# Patient Record
Sex: Male | Born: 1955
Health system: Southern US, Community
[De-identification: ages and names within clinical notes are randomized; demographics above are authoritative.]

## PROBLEM LIST (undated history)

## (undated) DIAGNOSIS — G40909 Epilepsy, unspecified, not intractable, without status epilepticus: Secondary | ICD-10-CM

## (undated) DIAGNOSIS — R569 Unspecified convulsions: Secondary | ICD-10-CM

## (undated) DIAGNOSIS — E785 Hyperlipidemia, unspecified: Secondary | ICD-10-CM

## (undated) DIAGNOSIS — N390 Urinary tract infection, site not specified: Secondary | ICD-10-CM

## (undated) HISTORY — DX: Hyperlipidemia, unspecified: E78.5

## (undated) HISTORY — DX: Urinary tract infection, site not specified: N39.0

## (undated) HISTORY — DX: Epilepsy, unspecified, not intractable, without status epilepticus: G40.909

## (undated) HISTORY — DX: Unspecified convulsions: R56.9

---

## 1973-03-02 HISTORY — PX: NASAL SEPTUM SURGERY: SHX37

## 2003-06-04 ENCOUNTER — Ambulatory Visit (HOSPITAL_COMMUNITY): Admission: RE | Admit: 2003-06-04 | Discharge: 2003-06-04 | Payer: Self-pay | Admitting: Family Medicine

## 2003-12-03 ENCOUNTER — Ambulatory Visit (HOSPITAL_COMMUNITY): Admission: RE | Admit: 2003-12-03 | Discharge: 2003-12-03 | Payer: Self-pay | Admitting: Neurology

## 2004-09-12 ENCOUNTER — Ambulatory Visit (HOSPITAL_COMMUNITY): Admission: RE | Admit: 2004-09-12 | Discharge: 2004-09-12 | Payer: Self-pay | Admitting: Gastroenterology

## 2005-10-16 ENCOUNTER — Ambulatory Visit (HOSPITAL_COMMUNITY): Admission: RE | Admit: 2005-10-16 | Discharge: 2005-10-16 | Payer: Self-pay | Admitting: Gastroenterology

## 2006-03-27 LAB — HM COLONOSCOPY

## 2010-09-25 ENCOUNTER — Ambulatory Visit (INDEPENDENT_AMBULATORY_CARE_PROVIDER_SITE_OTHER): Payer: BC Managed Care – PPO | Admitting: Family Medicine

## 2010-09-25 ENCOUNTER — Encounter: Payer: Self-pay | Admitting: Family Medicine

## 2010-09-25 VITALS — BP 118/70 | HR 72 | Temp 98.1°F | Resp 12 | Ht 69.5 in | Wt 193.0 lb

## 2010-09-25 DIAGNOSIS — Z8669 Personal history of other diseases of the nervous system and sense organs: Secondary | ICD-10-CM | POA: Insufficient documentation

## 2010-09-25 DIAGNOSIS — E785 Hyperlipidemia, unspecified: Secondary | ICD-10-CM

## 2010-09-25 MED ORDER — COLESEVELAM HCL 625 MG PO TABS: 1250.0000 mg | ORAL_TABLET | Freq: Two times a day (BID) | ORAL | Status: DC

## 2010-09-25 NOTE — Progress Notes (Signed)
  Subjective:    Patient ID: Curtis Sheppard, male    DOB: Sep 14, 1955, 55 y.o.   MRN: [id number]  HPI Patient is new to establish care. Past medical history reviewed. History of solitary seizure about 6 years ago but none since then. He sees the neurologist maintained on Dilantin. He has history of dyslipidemia. Previous myalgia with Lipitor apparently did not try other statins. He was concerned that somehow severe leg cramps might have triggered his seizure. He does take WelChol for lipids. No myalgias. No side effects. Patient also takes omega-3 supplement. He is allergic to penicillin. Previous tonsillectomy.  Mother and father had lung cancer. Brother had coronary disease in his 21s. Maternal grandmother dementia. One brother with bipolar disorder  Patient is married. Occasional alcohol use. Nonsmoker.   Review of Systems  Constitutional: Negative for fever, chills and appetite change.  Eyes: Negative for visual disturbance.  Respiratory: Negative for cough and shortness of breath.   Cardiovascular: Negative for chest pain.  Gastrointestinal: Negative for abdominal pain.  Skin: Negative for rash.  Neurological: Negative for dizziness, syncope, weakness and headaches.  Psychiatric/Behavioral: Negative for dysphoric mood.       Objective:   Physical Exam  Constitutional: He is oriented to person, place, and time. He appears well-developed and well-nourished.  HENT:  Mouth/Throat: Oropharynx is clear and moist.  Neck: Neck supple. No thyromegaly present.  Cardiovascular: Normal rate, regular rhythm and normal heart sounds.   Pulmonary/Chest: Effort normal and breath sounds normal. No respiratory distress. He has no wheezes. He has no rales.  Musculoskeletal: He exhibits no edema.  Lymphadenopathy:    He has no cervical adenopathy.  Neurological: He is alert and oriented to person, place, and time.          Assessment & Plan:  #1 history of dyslipidemia. Refill WelChol.  Physical by December and recheck lipids then #2 history of seizures. None in 6 years. Continue followup with neurology

## 2011-01-15 ENCOUNTER — Other Ambulatory Visit: Payer: Self-pay | Admitting: Family Medicine

## 2011-01-15 DIAGNOSIS — E785 Hyperlipidemia, unspecified: Secondary | ICD-10-CM

## 2011-01-15 MED ORDER — COLESEVELAM HCL 625 MG PO TABS: 1250.0000 mg | ORAL_TABLET | Freq: Two times a day (BID) | ORAL | Status: DC

## 2011-01-15 NOTE — Telephone Encounter (Signed)
Pt called req refill of colesevelam (WELCHOL) 625 MG tablet to be called in to [facility] on [address]. Pt completely out of med. Pt has sch cpx for 03/23/11 and Labs on 03/13/11.

## 2011-01-19 ENCOUNTER — Telehealth: Payer: Self-pay | Admitting: Family Medicine

## 2011-01-19 NOTE — Telephone Encounter (Signed)
Pt called re: his refill of colesevelam (WELCHOL) 625 MG tablet. Pt checked with [facility] on Friday and Saturday, pharmacy did not rcv script. Pls resend.

## 2011-01-19 NOTE — Telephone Encounter (Signed)
I called [facility], they have 2 meds ready to pick-up, pt informed

## 2011-03-05 ENCOUNTER — Other Ambulatory Visit: Payer: Self-pay | Admitting: *Deleted

## 2011-03-05 MED ORDER — DUTASTERIDE 0.5 MG PO CAPS: 0.5000 mg | ORAL_CAPSULE | Freq: Every day | ORAL | Status: DC

## 2011-03-13 ENCOUNTER — Other Ambulatory Visit (INDEPENDENT_AMBULATORY_CARE_PROVIDER_SITE_OTHER): Payer: BC Managed Care – PPO

## 2011-03-13 DIAGNOSIS — Z0000 Encounter for general adult medical examination without abnormal findings: Secondary | ICD-10-CM

## 2011-03-13 LAB — CBC WITH DIFFERENTIAL/PLATELET
Basophils Absolute: 0 10*3/uL (ref 0.0–0.1)
Basophils Relative: 0.7 % (ref 0.0–3.0)
Eosinophils Absolute: 0.2 10*3/uL (ref 0.0–0.7)
Eosinophils Relative: 4.5 % (ref 0.0–5.0)
HCT: 44.7 % (ref 39.0–52.0)
Hemoglobin: 15.5 g/dL (ref 13.0–17.0)
Lymphocytes Relative: 30 % (ref 12.0–46.0)
Lymphs Abs: 1.6 10*3/uL (ref 0.7–4.0)
MCHC: 34.6 g/dL (ref 30.0–36.0)
MCV: 97.6 fl (ref 78.0–100.0)
Monocytes Absolute: 0.3 10*3/uL (ref 0.1–1.0)
Monocytes Relative: 6.2 % (ref 3.0–12.0)
Neutro Abs: 3.1 10*3/uL (ref 1.4–7.7)
Neutrophils Relative %: 58.6 % (ref 43.0–77.0)
Platelets: 188 10*3/uL (ref 150.0–400.0)
RBC: 4.59 Mil/uL (ref 4.22–5.81)
RDW: 13.1 % (ref 11.5–14.6)
WBC: 5.3 10*3/uL (ref 4.5–10.5)

## 2011-03-13 LAB — POCT URINALYSIS DIPSTICK
Bilirubin, UA: NEGATIVE
Blood, UA: NEGATIVE
Glucose, UA: NEGATIVE
Ketones, UA: NEGATIVE
Leukocytes, UA: NEGATIVE
Nitrite, UA: NEGATIVE
Protein, UA: NEGATIVE
Spec Grav, UA: 1.02
Urobilinogen, UA: 0.2
pH, UA: 6.5

## 2011-03-13 LAB — LIPID PANEL
Cholesterol: 261 mg/dL — ABNORMAL HIGH (ref 0–200)
HDL: 56.3 mg/dL (ref >39.00)
Total CHOL/HDL Ratio: 5
Triglycerides: 256 mg/dL — ABNORMAL HIGH (ref 0.0–149.0)
VLDL: 51.2 mg/dL — ABNORMAL HIGH (ref 0.0–40.0)

## 2011-03-13 LAB — PSA: PSA: 2.4 ng/mL (ref 0.10–4.00)

## 2011-03-13 LAB — HEPATIC FUNCTION PANEL
ALT: 27 U/L (ref 0–53)
AST: 20 U/L (ref 0–37)
Albumin: 4.3 g/dL (ref 3.5–5.2)
Alkaline Phosphatase: 59 U/L (ref 39–117)
Bilirubin, Direct: 0.1 mg/dL (ref 0.0–0.3)
Total Bilirubin: 0.7 mg/dL (ref 0.3–1.2)
Total Protein: 6.9 g/dL (ref 6.0–8.3)

## 2011-03-13 LAB — BASIC METABOLIC PANEL
BUN: 16 mg/dL (ref 6–23)
CO2: 28 mEq/L (ref 19–32)
Calcium: 9.3 mg/dL (ref 8.4–10.5)
Chloride: 105 mEq/L (ref 96–112)
Creatinine, Ser: 1 mg/dL (ref 0.4–1.5)
GFR: 95.63 mL/min (ref >60.00)
Glucose, Bld: 98 mg/dL (ref 70–99)
Potassium: 4.5 mEq/L (ref 3.5–5.1)
Sodium: 140 mEq/L (ref 135–145)

## 2011-03-13 LAB — TSH: TSH: 1.82 u[IU]/mL (ref 0.35–5.50)

## 2011-03-13 LAB — LDL CHOLESTEROL, DIRECT: Direct LDL: 149.2 mg/dL

## 2011-03-14 LAB — PHENYTOIN LEVEL, TOTAL: Phenytoin Lvl: 13.4 ug/mL (ref 10.0–20.0)

## 2011-03-23 ENCOUNTER — Ambulatory Visit (INDEPENDENT_AMBULATORY_CARE_PROVIDER_SITE_OTHER): Payer: BC Managed Care – PPO | Admitting: Family Medicine

## 2011-03-23 ENCOUNTER — Encounter: Payer: Self-pay | Admitting: Family Medicine

## 2011-03-23 VITALS — BP 140/80 | HR 80 | Temp 98.0°F | Resp 12 | Ht 69.0 in | Wt 200.0 lb

## 2011-03-23 DIAGNOSIS — Z0000 Encounter for general adult medical examination without abnormal findings: Secondary | ICD-10-CM

## 2011-03-23 DIAGNOSIS — M109 Gout, unspecified: Secondary | ICD-10-CM | POA: Insufficient documentation

## 2011-03-23 DIAGNOSIS — E785 Hyperlipidemia, unspecified: Secondary | ICD-10-CM

## 2011-03-23 DIAGNOSIS — Z8669 Personal history of other diseases of the nervous system and sense organs: Secondary | ICD-10-CM

## 2011-03-23 MED ORDER — COLCHICINE 0.6 MG PO TABS: ORAL_TABLET | ORAL | Status: DC

## 2011-03-23 MED ORDER — ZOLPIDEM TARTRATE 10 MG PO TABS: 10.0000 mg | ORAL_TABLET | Freq: Every evening | ORAL | Status: DC | PRN

## 2011-03-23 MED ORDER — ROSUVASTATIN CALCIUM 5 MG PO TABS: 5.0000 mg | ORAL_TABLET | Freq: Every day | ORAL | Status: DC

## 2011-03-23 NOTE — Progress Notes (Signed)
Subjective:    Patient ID: Curtis Sheppard, male    DOB: 11-Sep-1955, 56 y.o.   MRN: [id number]  HPI  Patient here for complete physical and followup medical problems. History of dyslipidemia and idiopathic seizure disorder. Has only had one seizure previously. Sees neurologist. Takes Dilantin regularly. Also has history of gout with infrequent flareups. He takes colchicine as needed and requesting refills. He had some chronic intermittent insomnia and takes [place] as needed and requesting refills.  Tetanus 2009. Colonoscopy 2008 with five-year followup recommended. No history of smoking. Inconsistent exercise.  History of dyslipidemia. Previous intolerance to niacin. Previous nausea with Lipitor. Does not use of other statins. 2 brothers had coronary disease in their 39s and 73s  Past Medical History  Diagnosis Date  . Hyperlipidemia   . Seizures   . UTI (urinary tract infection)    Past Surgical History  Procedure Date  . Nasal septum surgery 1975    deviated    reports that he has never smoked. He does not have any smokeless tobacco history on file. His alcohol and drug histories not on file. family history includes Bipolar disorder in his brother; Cancer in his father and mother; Dementia in his maternal grandmother; Heart disease in his brother; and Stroke in his paternal grandfather. Allergies  Allergen Reactions  . Penicillins     As a child      Review of Systems  Constitutional: Negative for fever, activity change, appetite change and fatigue.  HENT: Negative for ear pain, congestion and trouble swallowing.   Eyes: Negative for pain and visual disturbance.  Respiratory: Negative for cough, shortness of breath and wheezing.   Cardiovascular: Negative for chest pain and palpitations.  Gastrointestinal: Negative for nausea, vomiting, abdominal pain, diarrhea, constipation, blood in stool, abdominal distention and rectal pain.  Genitourinary: Negative for dysuria,  hematuria and testicular pain.  Musculoskeletal: Negative for joint swelling and arthralgias.  Skin: Negative for rash.  Neurological: Negative for dizziness, syncope and headaches.  Hematological: Negative for adenopathy.  Psychiatric/Behavioral: Negative for confusion and dysphoric mood.       Objective:   Physical Exam  Constitutional: He is oriented to person, place, and time. He appears well-developed and well-nourished. No distress.  HENT:  Head: Normocephalic and atraumatic.  Right Ear: External ear normal.  Left Ear: External ear normal.  Mouth/Throat: Oropharynx is clear and moist.  Eyes: Conjunctivae and EOM are normal. Pupils are equal, round, and reactive to light.  Neck: Normal range of motion. Neck supple. No thyromegaly present.  Cardiovascular: Normal rate, regular rhythm and normal heart sounds.   No murmur heard. Pulmonary/Chest: No respiratory distress. He has no wheezes. He has no rales.  Abdominal: Soft. Bowel sounds are normal. He exhibits no distension and no mass. There is no tenderness. There is no rebound and no guarding.  Musculoskeletal: He exhibits no edema.  Lymphadenopathy:    He has no cervical adenopathy.  Neurological: He is alert and oriented to person, place, and time. He displays normal reflexes. No cranial nerve deficit.  Skin: No rash noted.  Psychiatric: He has a normal mood and affect.          Assessment & Plan:  #1 health maintenance. Discussed consistent exercise. Colonoscopy repeat later this year. He will contact gastroenterologist if not contacted soon #2 dyslipidemia. Positive family history of premature CAD. Trial of Crestor 5 mg daily for elevated lipids. He will stop WelChol after starting statin #3 intermittent insomnia. Refill Ambien #4 history of  gout. Refill colchicine for as needed use

## 2011-03-30 ENCOUNTER — Other Ambulatory Visit: Payer: Self-pay | Admitting: Family Medicine

## 2011-03-30 DIAGNOSIS — E785 Hyperlipidemia, unspecified: Secondary | ICD-10-CM

## 2011-03-30 MED ORDER — SIMVASTATIN 20 MG PO TABS: 20.0000 mg | ORAL_TABLET | Freq: Every day | ORAL | Status: DC

## 2011-03-30 NOTE — Telephone Encounter (Signed)
Had intolerance to atorvastatin.  Try Simvastatin 20 mg daily and repeat lipids and hepatic in 6-8 weeks.

## 2011-03-30 NOTE — Telephone Encounter (Signed)
New med sent to [facility], future labs ordered

## 2011-03-30 NOTE — Telephone Encounter (Signed)
Pt informed on home VM of new med and future labs

## 2011-03-30 NOTE — Telephone Encounter (Signed)
Patient's prior auth for Crestor was denied. Per [employer] he cannot receive coverage for Crestor until he has tried a generically available statin: lovastatin, pravastatin, simvastatin, atorvastatin. Please advise.

## 2011-04-04 ENCOUNTER — Other Ambulatory Visit: Payer: Self-pay | Admitting: Family Medicine

## 2011-06-16 ENCOUNTER — Other Ambulatory Visit (INDEPENDENT_AMBULATORY_CARE_PROVIDER_SITE_OTHER): Payer: BC Managed Care – PPO

## 2011-06-16 DIAGNOSIS — E785 Hyperlipidemia, unspecified: Secondary | ICD-10-CM

## 2011-06-16 LAB — HEPATIC FUNCTION PANEL
ALT: 37 U/L (ref 0–53)
AST: 20 U/L (ref 0–37)
Albumin: 4.5 g/dL (ref 3.5–5.2)
Alkaline Phosphatase: 51 U/L (ref 39–117)
Bilirubin, Direct: 0.1 mg/dL (ref 0.0–0.3)
Total Bilirubin: 0.6 mg/dL (ref 0.3–1.2)
Total Protein: 7 g/dL (ref 6.0–8.3)

## 2011-06-16 LAB — LIPID PANEL
Cholesterol: 200 mg/dL (ref 0–200)
HDL: 54.2 mg/dL (ref >39.00)
Total CHOL/HDL Ratio: 4
Triglycerides: 206 mg/dL — ABNORMAL HIGH (ref 0.0–149.0)
VLDL: 41.2 mg/dL — ABNORMAL HIGH (ref 0.0–40.0)

## 2011-06-16 LAB — LDL CHOLESTEROL, DIRECT: Direct LDL: 113.3 mg/dL

## 2011-06-19 NOTE — Progress Notes (Signed)
Quick Note:  Pt informed ______ 

## 2011-07-27 ENCOUNTER — Emergency Department (HOSPITAL_COMMUNITY)
Admission: EM | Admit: 2011-07-27 | Discharge: 2011-07-27 | Disposition: A | Discharge status: Home / Self Care | Payer: BC Managed Care – PPO | Attending: Emergency Medicine | Admitting: Emergency Medicine

## 2011-07-27 ENCOUNTER — Encounter (HOSPITAL_COMMUNITY): Payer: Self-pay | Admitting: *Deleted

## 2011-07-27 DIAGNOSIS — E785 Hyperlipidemia, unspecified: Secondary | ICD-10-CM | POA: Insufficient documentation

## 2011-07-27 DIAGNOSIS — Z79899 Other long term (current) drug therapy: Secondary | ICD-10-CM | POA: Insufficient documentation

## 2011-07-27 DIAGNOSIS — T6391XA Toxic effect of contact with unspecified venomous animal, accidental (unintentional), initial encounter: Secondary | ICD-10-CM | POA: Insufficient documentation

## 2011-07-27 DIAGNOSIS — M79609 Pain in unspecified limb: Secondary | ICD-10-CM | POA: Insufficient documentation

## 2011-07-27 DIAGNOSIS — W5911XA Bitten by nonvenomous snake, initial encounter: Secondary | ICD-10-CM

## 2011-07-27 DIAGNOSIS — T63001A Toxic effect of unspecified snake venom, accidental (unintentional), initial encounter: Secondary | ICD-10-CM | POA: Insufficient documentation

## 2011-07-27 DIAGNOSIS — M7989 Other specified soft tissue disorders: Secondary | ICD-10-CM | POA: Insufficient documentation

## 2011-07-27 DIAGNOSIS — G40909 Epilepsy, unspecified, not intractable, without status epilepticus: Secondary | ICD-10-CM | POA: Insufficient documentation

## 2011-07-27 LAB — CBC
HCT: 37.9 % — ABNORMAL LOW (ref 39.0–52.0)
HCT: 38.6 % — ABNORMAL LOW (ref 39.0–52.0)
Hemoglobin: 13.8 g/dL (ref 13.0–17.0)
Hemoglobin: 14.1 g/dL (ref 13.0–17.0)
MCH: 32.9 pg (ref 26.0–34.0)
MCH: 32.8 pg (ref 26.0–34.0)
MCHC: 36.4 g/dL — ABNORMAL HIGH (ref 30.0–36.0)
MCHC: 36.5 g/dL — ABNORMAL HIGH (ref 30.0–36.0)
MCV: 90.2 fL (ref 78.0–100.0)
MCV: 89.8 fL (ref 78.0–100.0)
Platelets: 147 10*3/uL — ABNORMAL LOW (ref 150–400)
Platelets: 154 10*3/uL (ref 150–400)
RBC: 4.2 MIL/uL — ABNORMAL LOW (ref 4.22–5.81)
RBC: 4.3 MIL/uL (ref 4.22–5.81)
RDW: 12.7 % (ref 11.5–15.5)
RDW: 12.8 % (ref 11.5–15.5)
WBC: 6.3 10*3/uL (ref 4.0–10.5)
WBC: 10.6 10*3/uL — ABNORMAL HIGH (ref 4.0–10.5)

## 2011-07-27 LAB — DIFFERENTIAL
Basophils Absolute: 0 10*3/uL (ref 0.0–0.1)
Basophils Absolute: 0 10*3/uL (ref 0.0–0.1)
Basophils Relative: 0 % (ref 0–1)
Basophils Relative: 0 % (ref 0–1)
Eosinophils Absolute: 0.1 10*3/uL (ref 0.0–0.7)
Eosinophils Absolute: 0.2 10*3/uL (ref 0.0–0.7)
Eosinophils Relative: 2 % (ref 0–5)
Eosinophils Relative: 2 % (ref 0–5)
Lymphocytes Relative: 25 % (ref 12–46)
Lymphocytes Relative: 19 % (ref 12–46)
Lymphs Abs: 1.6 10*3/uL (ref 0.7–4.0)
Lymphs Abs: 2 10*3/uL (ref 0.7–4.0)
Monocytes Absolute: 0.6 10*3/uL (ref 0.1–1.0)
Monocytes Absolute: 0.6 10*3/uL (ref 0.1–1.0)
Monocytes Relative: 9 % (ref 3–12)
Monocytes Relative: 6 % (ref 3–12)
Neutro Abs: 4.1 10*3/uL (ref 1.7–7.7)
Neutro Abs: 7.7 10*3/uL (ref 1.7–7.7)
Neutrophils Relative %: 64 % (ref 43–77)
Neutrophils Relative %: 73 % (ref 43–77)

## 2011-07-27 LAB — BASIC METABOLIC PANEL
BUN: 19 mg/dL (ref 6–23)
CO2: 26 mEq/L (ref 19–32)
Calcium: 9 mg/dL (ref 8.4–10.5)
Chloride: 106 mEq/L (ref 96–112)
Creatinine, Ser: 0.93 mg/dL (ref 0.50–1.35)
GFR calc Af Amer: >90 mL/min (ref >90)
GFR calc non Af Amer: >90 mL/min (ref >90)
Glucose, Bld: 90 mg/dL (ref 70–99)
Potassium: 4.1 mEq/L (ref 3.5–5.1)
Sodium: 141 mEq/L (ref 135–145)

## 2011-07-27 LAB — PROTIME-INR
INR: 0.98 (ref 0.00–1.49)
INR: 0.97 (ref 0.00–1.49)
Prothrombin Time: 13.2 seconds (ref 11.6–15.2)
Prothrombin Time: 13.1 seconds (ref 11.6–15.2)

## 2011-07-27 LAB — D-DIMER, QUANTITATIVE: D-Dimer, Quant: <0.22 ug/mL-FEU (ref 0.00–0.48)

## 2011-07-27 LAB — FIBRINOGEN
Fibrinogen: 191 mg/dL — ABNORMAL LOW (ref 204–475)
Fibrinogen: 198 mg/dL — ABNORMAL LOW (ref 204–475)

## 2011-07-27 MED ORDER — OXYCODONE-ACETAMINOPHEN 5-325 MG PO TABS
1.0000 | ORAL_TABLET | Freq: Once | ORAL | Status: AC
  Administered 2011-07-27: 1 via ORAL
  Filled 2011-07-27: qty 1

## 2011-07-27 MED ORDER — OXYCODONE-ACETAMINOPHEN 5-325 MG PO TABS: 1.0000 | ORAL_TABLET | Freq: Four times a day (QID) | ORAL | Status: AC | PRN

## 2011-07-27 MED ORDER — OXYCODONE HCL 10 MG PO TB12: 10.0000 mg | ORAL_TABLET | Freq: Two times a day (BID) | ORAL | Status: DC

## 2011-07-27 NOTE — ED Provider Notes (Addendum)
History   This chart was scribed for Gwyneth Sprout, MD by Brooks Sailors. The patient was seen in room STRE1/STRE1. Patient's care was started at 1600.   CSN: [id number]  Arrival date & time 07/27/11  1600   First MD Initiated Contact with Patient 07/27/11 1650      Chief Complaint  Patient presents with  . Snake Bite    (Consider location/radiation/quality/duration/timing/severity/associated sxs/prior treatment) HPI  Curtis Sheppard is a 56 y.o. male who presents to the Emergency Department complaining of a moderate to sever snake bite today at 1630 this afternoon 1.5 hours ago. Pt thinks he was struck by a baby copperhead in the right hand. Last tetanus was 3-4 years ago. Pt has swelling and pain in the right hand.    Past Medical History  Diagnosis Date  . Hyperlipidemia   . Seizures   . UTI (urinary tract infection)     Past Surgical History  Procedure Date  . Nasal septum surgery 1975    deviated    Family History  Problem Relation Age of Onset  . Cancer Mother     lung  . Cancer Father     lung  . Heart disease Brother     attack  . Bipolar disorder Brother     paranoid manic, depressive bipolar  . Dementia Maternal Grandmother   . Stroke Paternal Grandfather     History  Substance Use Topics  . Smoking status: Never Smoker   . Smokeless tobacco: Not on file  . Alcohol Use: Not on file      Review of Systems  All other systems reviewed and are negative.    Allergies  Penicillins  Home Medications   Current Outpatient Rx  Name Route Sig Dispense Refill  . CALCIUM 1200 PO Oral Take 1 tablet by mouth daily.     Marland Kitchen DIPHENHYDRAMINE HCL 25 MG PO CAPS Oral Take 25 mg by mouth once. For snake bite    . FISH OIL 1200 MG PO CAPS Oral Take 2 capsules by mouth 2 (two) times daily.     Marland Kitchen PHENYTOIN SODIUM EXTENDED 100 MG PO CAPS Oral Take 200-300 mg by mouth 2 (two) times daily. Take 3 capsules in the morning and 2 capsules at night    .  SIMVASTATIN 20 MG PO TABS Oral Take 1 tablet (20 mg total) by mouth at bedtime. 30 tablet 3  . TAMSULOSIN HCL 0.4 MG PO CAPS Oral Take 0.4 mg by mouth daily.    Marland Kitchen ZOLPIDEM TARTRATE 10 MG PO TABS Oral Take 5 mg by mouth at bedtime as needed. For sleep    . COLCHICINE 0.6 MG PO TABS Oral Take 0.6 mg by mouth daily as needed. For gout.      BP 128/76  Pulse 64  Temp(Src) 98.3 F (36.8 C) (Oral)  Resp 18  SpO2 98%  Physical Exam  Nursing note and vitals reviewed. Constitutional: He is oriented to person, place, and time. He appears well-developed and well-nourished. No distress.  HENT:  Head: Normocephalic and atraumatic.  Eyes: EOM are normal.  Neck: Neck supple. No tracheal deviation present.  Musculoskeletal: Normal range of motion.       Right Hand, puncture mark hypothenar eminence with surrounding edema stops at the wrist. Normal cap refill normal function and normal pulse.   Neurological: He is alert and oriented to person, place, and time.  Skin: Skin is warm and dry.  Psychiatric: He has a normal mood and  affect. His behavior is normal.    ED Course  Procedures (including critical care time)  Pt seen at 1655 poison control called for consult on snake bite.    Results for orders placed in visit on 06/16/11  LIPID PANEL      Component Value Range   Cholesterol 200  0 - 200 (mg/dL)   Triglycerides 161.0 (*) 0.0 - 149.0 (mg/dL)   HDL 96.04  >54.09 (mg/dL)   VLDL 81.1 (*) 0.0 - 40.0 (mg/dL)   Total CHOL/HDL Ratio 4    HEPATIC FUNCTION PANEL      Component Value Range   Total Bilirubin 0.6  0.3 - 1.2 (mg/dL)   Bilirubin, Direct 0.1  0.0 - 0.3 (mg/dL)   Alkaline Phosphatase 51  39 - 117 (U/L)   AST 20  0 - 37 (U/L)   ALT 37  0 - 53 (U/L)   Total Protein 7.0  6.0 - 8.3 (g/dL)   Albumin 4.5  3.5 - 5.2 (g/dL)  LDL CHOLESTEROL, DIRECT      Component Value Range   Direct LDL 113.3      Labs Reviewed - No data to display No results found.   No diagnosis  found.    MDM   Patient withthe right today 1 hour and 30 minutes prior to being seen. The bite was to his right hand and he thinks it was a copperhead. The right hand is swollen but not ecchymotic or red. The swelling is in the hyperthenar eminence where his hand was bitten and stops at the wrist. Snake bite protocol he is currently a #1 weaning he is currently has a mild envenomation. His lab work is pending and will speak with poison control. No signs of compartment syndrome. Patient was sent to the CDU to await lab test results   I personally performed the services described in this documentation, which was scribed in my presence.  The recorded information has been reviewed and considered.        Gwyneth Sprout, MD 07/27/11 9147  Gwyneth Sprout, MD 07/27/11 2006

## 2011-07-27 NOTE — ED Notes (Signed)
The pt has had a snake bite to the  Palmar surface of his rt hand.  He was pulling weeds when he was bitten.   He struck the snake with a paddle but could not find it.Marland Kitchen  Sl swelling it occurred 40 minutes

## 2011-07-27 NOTE — Discharge Instructions (Signed)
Snake Bite  Snakes may be either venomous (containing poison) or nonvenomous (nonpoisonous). A nonvenomous snake bite will cause trauma or a wound to the skin and possibly the deeper tissues. A venomous snake will also cause a traumatic wound, but more importantly, it may have injected venom into the wound. Snake bite venom can be extremely serious and even deadly. One type of venom may cause major skin, tissue and muscle damage, and failure of normal blood clotting. This may cause extreme swelling and pain of the affected area. Another type of venom can affect the brain and nervous system and may cause death. The treatment for venomous snake bite may require the use of antivenom medicine. If you are unsure if your bite is from a venomous snake, you MUST seek immediate medical attention.  YOU MIGHT NEED A TETANUS SHOT NOW IF:   You have no idea when you had the last one.   You have never had a tetanus shot before.   The bite broke your skin.  If you need a tetanus shot, and you decide not to get one, there is a rare chance of getting tetanus. Sickness from tetanus can be serious.  HOME CARE INSTRUCTIONS   A snake bit you and caused a skin wound. It may or may not have been venomous. If the snake was venomous, a small amount of venom may have been injected into your skin.   Keep the bite area clean and dry.   Keep the extremity elevated above the level of the heart for the next 48 hours.   Wash the bite area 3 times daily with soap and water or an antiseptic. Apply an adhesive or gauze bandage to the bite area.   If you develop blistering of any type at the site of the bite, protect the blisters from breaking. Do not attempt to open it.   If you were given a tetanus shot, your arm may get swollen, red and warm at the shot site. This is a common response to the injection.  SEEK IMMEDIATE MEDICAL CARE IF:    You develop symptoms of poisoning including increased pain, redness, swelling, blood blisters or purple  spots in the bite area, nausea, vomiting, numbness, tingling, excessive sweating, breathing difficulty, blurred vision, feelings of lightheadedness, or feeling faint. If you develop symptoms of poisoning, you MUST seek immediate medical attention.   The bite becomes infected. Symptoms may include redness, swelling, pain, tenderness, pus, red streaks running from the wound, or an oral temperature above 102 F (38.9 C), not controlled by medicine.   Your condition or wound becomes worse.  MAKE SURE YOU:    Understand these instructions.   Will watch your condition.   Will get help right away if you are not doing well or get worse.  Document Released: 02/14/2000 Document Revised: 02/05/2011 Document Reviewed: 07/10/2009  ExitCare Patient Information 2012 [employer].

## 2011-07-27 NOTE — ED Provider Notes (Signed)
Assumed patient care from Dr. Anitra Lauth. 56 year old male with an apparent snake bite to the thenar eminence of right hand at 1630. This is a possible copperhead snake bite. Right hand is moderately edematous, does not extend past wrist. Mildly visible bite mark. No bruising. No necrosis noted. No foreign body noted. Able to move all fingers with brisk capillary refills. Normal wrist movement. Radial pulse 2+. Area was marked with marking pen. Arm is elevated. Has been clean. Poison Control Center [phone]) has been consult. Plan to monitor patient for 6 hours. Will draw d-dimer, fibrinogen, CBC, PT and INR at the six-hour mark (2130). Patient is currently in no acute distress, vital signs stable, afebrile, and is aware of plan.  10:49 PM Pt sts sxs remain the same, swelling minimally decreased.  Afebrile, no significant swelling to wrist or forearm, brisk cap refills.  Repeat labs shows no significant finding. Poison control center has called to recheck.  Poison Control Centerl will call pt in AM for recheck as well.  Pt stable to be discharge.  Care instruction given, with emphasis of keeping hand elevated above heart.  Pt voice understanding and agrees with plan.    Results for orders placed during the hospital encounter of 07/27/11  CBC      Component Value Range   WBC 6.3  4.0 - 10.5 (K/uL)   RBC 4.20 (*) 4.22 - 5.81 (MIL/uL)   Hemoglobin 13.8  13.0 - 17.0 (g/dL)   HCT 98.1 (*) 19.1 - 52.0 (%)   MCV 90.2  78.0 - 100.0 (fL)   MCH 32.9  26.0 - 34.0 (pg)   MCHC 36.4 (*) 30.0 - 36.0 (g/dL)   RDW 47.8  29.5 - 62.1 (%)   Platelets 147 (*) 150 - 400 (K/uL)  DIFFERENTIAL      Component Value Range   Neutrophils Relative 64  43 - 77 (%)   Neutro Abs 4.1  1.7 - 7.7 (K/uL)   Lymphocytes Relative 25  12 - 46 (%)   Lymphs Abs 1.6  0.7 - 4.0 (K/uL)   Monocytes Relative 9  3 - 12 (%)   Monocytes Absolute 0.6  0.1 - 1.0 (K/uL)   Eosinophils Relative 2  0 - 5 (%)   Eosinophils Absolute 0.1  0.0 -  0.7 (K/uL)   Basophils Relative 0  0 - 1 (%)   Basophils Absolute 0.0  0.0 - 0.1 (K/uL)  PROTIME-INR      Component Value Range   Prothrombin Time 13.2  11.6 - 15.2 (seconds)   INR 0.98  0.00 - 1.49   FIBRINOGEN      Component Value Range   Fibrinogen 191 (*) 204 - 475 (mg/dL)  BASIC METABOLIC PANEL      Component Value Range   Sodium 141  135 - 145 (mEq/L)   Potassium 4.1  3.5 - 5.1 (mEq/L)   Chloride 106  96 - 112 (mEq/L)   CO2 26  19 - 32 (mEq/L)   Glucose, Bld 90  70 - 99 (mg/dL)   BUN 19  6 - 23 (mg/dL)   Creatinine, Ser 3.08  0.50 - 1.35 (mg/dL)   Calcium 9.0  8.4 - 65.7 (mg/dL)   GFR calc non Af Amer >90  >90 (mL/min)   GFR calc Af Amer >90  >90 (mL/min)  PROTIME-INR      Component Value Range   Prothrombin Time 13.1  11.6 - 15.2 (seconds)   INR 0.97  0.00 - 1.49   FIBRINOGEN  Component Value Range   Fibrinogen 198 (*) 204 - 475 (mg/dL)  D-DIMER, QUANTITATIVE      Component Value Range   D-Dimer, Quant <0.22  0.00 - 0.48 (ug/mL-FEU)  CBC      Component Value Range   WBC 10.6 (*) 4.0 - 10.5 (K/uL)   RBC 4.30  4.22 - 5.81 (MIL/uL)   Hemoglobin 14.1  13.0 - 17.0 (g/dL)   HCT 11.9 (*) 14.7 - 52.0 (%)   MCV 89.8  78.0 - 100.0 (fL)   MCH 32.8  26.0 - 34.0 (pg)   MCHC 36.5 (*) 30.0 - 36.0 (g/dL)   RDW 82.9  56.2 - 13.0 (%)   Platelets 154  150 - 400 (K/uL)  DIFFERENTIAL      Component Value Range   Neutrophils Relative 73  43 - 77 (%)   Neutro Abs 7.7  1.7 - 7.7 (K/uL)   Lymphocytes Relative 19  12 - 46 (%)   Lymphs Abs 2.0  0.7 - 4.0 (K/uL)   Monocytes Relative 6  3 - 12 (%)   Monocytes Absolute 0.6  0.1 - 1.0 (K/uL)   Eosinophils Relative 2  0 - 5 (%)   Eosinophils Absolute 0.2  0.0 - 0.7 (K/uL)   Basophils Relative 0  0 - 1 (%)   Basophils Absolute 0.0  0.0 - 0.1 (K/uL)       Fayrene Helper, PA-C 07/27/11 2252

## 2011-07-28 NOTE — ED Provider Notes (Signed)
Medical screening examination/treatment/procedure(s) were conducted as a shared visit with non-physician practitioner(s) and myself.  I personally evaluated the patient during the encounter   Curtis Sprout, MD 07/28/11 1655

## 2011-12-31 ENCOUNTER — Other Ambulatory Visit: Payer: Self-pay | Admitting: *Deleted

## 2011-12-31 MED ORDER — ZOLPIDEM TARTRATE 10 MG PO TABS: 5.0000 mg | ORAL_TABLET | Freq: Every evening | ORAL | Status: DC | PRN

## 2011-12-31 NOTE — Telephone Encounter (Signed)
Pt has CPX in January

## 2011-12-31 NOTE — Telephone Encounter (Signed)
Refill for 3 months.  Office f/u before further refills.

## 2011-12-31 NOTE — Telephone Encounter (Signed)
Zolpidem 10 mg, 1/2 to 1 tab at HS prn last filled 03-23-11 at [facility], #30 with 5 refills

## 2012-03-17 ENCOUNTER — Other Ambulatory Visit (INDEPENDENT_AMBULATORY_CARE_PROVIDER_SITE_OTHER): Payer: Commercial Managed Care - PPO

## 2012-03-17 DIAGNOSIS — E785 Hyperlipidemia, unspecified: Secondary | ICD-10-CM

## 2012-03-17 DIAGNOSIS — Z0000 Encounter for general adult medical examination without abnormal findings: Secondary | ICD-10-CM

## 2012-03-17 LAB — CBC WITH DIFFERENTIAL/PLATELET
Basophils Absolute: 0 10*3/uL (ref 0.0–0.1)
Basophils Relative: 0.9 % (ref 0.0–3.0)
Eosinophils Absolute: 0.1 10*3/uL (ref 0.0–0.7)
Eosinophils Relative: 2.6 % (ref 0.0–5.0)
HCT: 42.3 % (ref 39.0–52.0)
Hemoglobin: 14.7 g/dL (ref 13.0–17.0)
Lymphocytes Relative: 31.4 % (ref 12.0–46.0)
Lymphs Abs: 1.5 10*3/uL (ref 0.7–4.0)
MCHC: 34.7 g/dL (ref 30.0–36.0)
MCV: 93.2 fl (ref 78.0–100.0)
Monocytes Absolute: 0.3 10*3/uL (ref 0.1–1.0)
Monocytes Relative: 7.1 % (ref 3.0–12.0)
Neutro Abs: 2.7 10*3/uL (ref 1.4–7.7)
Neutrophils Relative %: 58 % (ref 43.0–77.0)
Platelets: 174 10*3/uL (ref 150.0–400.0)
RBC: 4.54 Mil/uL (ref 4.22–5.81)
RDW: 13.2 % (ref 11.5–14.6)
WBC: 4.6 10*3/uL (ref 4.5–10.5)

## 2012-03-17 LAB — POCT URINALYSIS DIPSTICK
Bilirubin, UA: NEGATIVE
Blood, UA: NEGATIVE
Glucose, UA: NEGATIVE
Ketones, UA: NEGATIVE
Leukocytes, UA: NEGATIVE
Nitrite, UA: NEGATIVE
Protein, UA: NEGATIVE
Spec Grav, UA: 1.02
Urobilinogen, UA: 0.2
pH, UA: 6

## 2012-03-17 LAB — TSH: TSH: 1.41 u[IU]/mL (ref 0.35–5.50)

## 2012-03-17 LAB — BASIC METABOLIC PANEL
BUN: 17 mg/dL (ref 6–23)
CO2: 28 mEq/L (ref 19–32)
Calcium: 9 mg/dL (ref 8.4–10.5)
Chloride: 104 mEq/L (ref 96–112)
Creatinine, Ser: 0.9 mg/dL (ref 0.4–1.5)
GFR: 114.27 mL/min (ref >60.00)
Glucose, Bld: 104 mg/dL — ABNORMAL HIGH (ref 70–99)
Potassium: 4.4 mEq/L (ref 3.5–5.1)
Sodium: 138 mEq/L (ref 135–145)

## 2012-03-17 LAB — LIPID PANEL
Cholesterol: 224 mg/dL — ABNORMAL HIGH (ref 0–200)
HDL: 61.9 mg/dL (ref >39.00)
Total CHOL/HDL Ratio: 4
Triglycerides: 145 mg/dL (ref 0.0–149.0)
VLDL: 29 mg/dL (ref 0.0–40.0)

## 2012-03-17 LAB — HEPATIC FUNCTION PANEL
ALT: 35 U/L (ref 0–53)
AST: 21 U/L (ref 0–37)
Albumin: 4.3 g/dL (ref 3.5–5.2)
Alkaline Phosphatase: 52 U/L (ref 39–117)
Bilirubin, Direct: 0.1 mg/dL (ref 0.0–0.3)
Total Bilirubin: 0.9 mg/dL (ref 0.3–1.2)
Total Protein: 6.8 g/dL (ref 6.0–8.3)

## 2012-03-17 LAB — LDL CHOLESTEROL, DIRECT: Direct LDL: 128.4 mg/dL

## 2012-03-17 LAB — PSA: PSA: 3.04 ng/mL (ref 0.10–4.00)

## 2012-03-18 LAB — PHENYTOIN LEVEL, TOTAL: Phenytoin Lvl: 5.3 ug/mL — ABNORMAL LOW (ref 10.0–20.0)

## 2012-03-24 ENCOUNTER — Encounter: Payer: Self-pay | Admitting: Family Medicine

## 2012-03-24 ENCOUNTER — Ambulatory Visit (INDEPENDENT_AMBULATORY_CARE_PROVIDER_SITE_OTHER): Payer: Commercial Managed Care - PPO | Admitting: Family Medicine

## 2012-03-24 VITALS — BP 140/82 | HR 60 | Temp 98.6°F | Resp 12 | Ht 68.75 in | Wt 190.0 lb

## 2012-03-24 DIAGNOSIS — Z0000 Encounter for general adult medical examination without abnormal findings: Secondary | ICD-10-CM

## 2012-03-24 MED ORDER — SIMVASTATIN 20 MG PO TABS: 20.0000 mg | ORAL_TABLET | Freq: Every day | ORAL | Status: DC

## 2012-03-24 MED ORDER — ZOLPIDEM TARTRATE 10 MG PO TABS: 10.0000 mg | ORAL_TABLET | Freq: Every evening | ORAL | Status: DC | PRN

## 2012-03-24 MED ORDER — COLCHICINE 0.6 MG PO TABS: 0.6000 mg | ORAL_TABLET | Freq: Two times a day (BID) | ORAL | Status: DC | PRN

## 2012-03-24 NOTE — Progress Notes (Signed)
Subjective:    Patient ID: Curtis Sheppard, male    DOB: January 01, 1956, 57 y.o.   MRN: [id number]  HPI Here for complete physical. Patient has history of gout within frequent flareups and takes prn colchicine . He has seizure disorder followed by neurology. Takes Dilantin. No recent seizures. Small ganglion type cyst middle finger left hand PIP joint. Nonpainful.  Tetanus up-to-date. Colonoscopy up to date. Prior history of BPH. Symptomatically stable off of Flomax.  He has lost 15 pounds this year due to his efforts.  Few month history of skin lesion which is of nodule anterior chest. Asymptomatic.  Past Medical History  Diagnosis Date  . Hyperlipidemia   . Seizures   . UTI (urinary tract infection)    Past Surgical History  Procedure Date  . Nasal septum surgery 1975    deviated    reports that he has never smoked. He does not have any smokeless tobacco history on file. His alcohol and drug histories not on file. family history includes Bipolar disorder in his brother; Cancer in his father and mother; Dementia in his maternal grandmother; Heart disease (age of onset:49) in his brother; and Stroke in his paternal grandfather. Allergies  Allergen Reactions  . Penicillins Other (See Comments)    Unknown       Review of Systems  Constitutional: Negative for fever, activity change, appetite change and fatigue.  HENT: Negative for ear pain, congestion and trouble swallowing.   Eyes: Negative for pain and visual disturbance.  Respiratory: Negative for cough, shortness of breath and wheezing.   Cardiovascular: Negative for chest pain and palpitations.  Gastrointestinal: Negative for nausea, vomiting, abdominal pain, diarrhea, constipation, blood in stool, abdominal distention and rectal pain.  Genitourinary: Negative for dysuria, hematuria and testicular pain.  Musculoskeletal: Negative for joint swelling and arthralgias.  Skin: Negative for rash.  Neurological: Negative for  dizziness, syncope and headaches.  Hematological: Negative for adenopathy.  Psychiatric/Behavioral: Negative for confusion and dysphoric mood.       Objective:   Physical Exam  Constitutional: He is oriented to person, place, and time. He appears well-developed and well-nourished. No distress.  HENT:  Head: Normocephalic and atraumatic.  Right Ear: External ear normal.  Left Ear: External ear normal.  Mouth/Throat: Oropharynx is clear and moist.  Eyes: Conjunctivae normal and EOM are normal. Pupils are equal, round, and reactive to light.  Neck: Normal range of motion. Neck supple. No thyromegaly present.  Cardiovascular: Normal rate, regular rhythm and normal heart sounds.   No murmur heard. Pulmonary/Chest: No respiratory distress. He has no wheezes. He has no rales.  Abdominal: Soft. Bowel sounds are normal. He exhibits no distension and no mass. There is no tenderness. There is no rebound and no guarding.  Genitourinary: Rectum normal.       Prostate is mildly enlarged but nontender with no nodules and no asymmetry  Musculoskeletal: He exhibits no edema.  Lymphadenopathy:    He has no cervical adenopathy.  Neurological: He is alert and oriented to person, place, and time. He displays normal reflexes. No cranial nerve deficit.  Skin: No rash noted.       Patient has approximately 8mm elevated fleshy colored nodule with well-demarcated borders mid anterior chest. Minimally scaly surface  Psychiatric: He has a normal mood and affect.          Assessment & Plan:  Complete physical. Labs reviewed with patient. Improvement in triglycerides and increase in HDL. Continue regular exercise. Refills of medication given. Get  back on Zocor. Schedule skin lesion excision for anterior chest nodule-rule out basal cell cancer.

## 2012-04-26 ENCOUNTER — Ambulatory Visit (INDEPENDENT_AMBULATORY_CARE_PROVIDER_SITE_OTHER): Payer: Commercial Managed Care - PPO | Admitting: Family Medicine

## 2012-04-26 ENCOUNTER — Encounter: Payer: Self-pay | Admitting: Family Medicine

## 2012-04-26 VITALS — BP 110/70 | Temp 98.3°F

## 2012-04-26 DIAGNOSIS — L989 Disorder of the skin and subcutaneous tissue, unspecified: Secondary | ICD-10-CM

## 2012-04-26 NOTE — Patient Instructions (Addendum)
Follow up if skin lesion recurs. I suspect you have a seborrheic keratosis which is a benign lesion.

## 2012-04-26 NOTE — Progress Notes (Signed)
  Subjective:    Patient ID: Curtis Sheppard, male    DOB: 1955/08/25, 57 y.o.   MRN: [id number]  HPI Patient here for skin lesion evaluation. Recent physical. We noted slightly nodular slightly scaly lesion anterior chest. Concerned for possible early basal cell. We were planning follow up today for excision.  Couple weeks ago this apparently fell off or was incidentally scraped off. No bleeding. No itching. No prior history of skin cancer. No family history of skin cancer.   Review of Systems  Constitutional: Negative for appetite change and unexpected weight change.       Objective:   Physical Exam  Constitutional: He appears well-developed and well-nourished.  Cardiovascular: Normal rate and regular rhythm.   Pulmonary/Chest: Effort normal and breath sounds normal. No respiratory distress. He has no wheezes. He has no rales.  Skin:  Anterior chest reveals approximately 8 mm slightly scaly well-demarcated area. There is no elevation and no nodular qualities as previously noted. No ulceration. No irregular pigmentation          Assessment & Plan:  Chest wall skin lesion. By history, suspect this is seborrheic keratosis which was  scraped off. Clinically, no suspicion for basal cell this time. Discussed options. We agreed on treatment with liquid nitrogen which he tolerated well.

## 2012-07-05 ENCOUNTER — Telehealth: Payer: Self-pay | Admitting: Neurology

## 2012-07-11 ENCOUNTER — Telehealth: Payer: Self-pay | Admitting: Neurology

## 2013-01-30 ENCOUNTER — Telehealth: Payer: Self-pay | Admitting: Nurse Practitioner

## 2013-01-30 ENCOUNTER — Telehealth: Payer: Self-pay | Admitting: Neurology

## 2013-01-30 MED ORDER — DILANTIN 100 MG PO CAPS: ORAL_CAPSULE | ORAL | Status: DC

## 2013-01-30 NOTE — Telephone Encounter (Signed)
Former Love patient assigned to Dr Vickey Huger.  Has an appt scheduled at the end of this month. Rx has been sent.

## 2013-01-31 ENCOUNTER — Telehealth: Payer: Self-pay

## 2013-01-31 NOTE — Telephone Encounter (Signed)
Ambien  Last visit 04/26/12 Last refill 03-24-12 #30 2  [facility]  [address]

## 2013-01-31 NOTE — Telephone Encounter (Signed)
Refill once 

## 2013-02-01 MED ORDER — ZOLPIDEM TARTRATE 10 MG PO TABS: 10.0000 mg | ORAL_TABLET | Freq: Every evening | ORAL | Status: DC | PRN

## 2013-02-01 NOTE — Telephone Encounter (Signed)
Called in pharmacy 

## 2013-02-20 ENCOUNTER — Encounter: Payer: Self-pay | Admitting: Neurology

## 2013-02-21 ENCOUNTER — Ambulatory Visit (INDEPENDENT_AMBULATORY_CARE_PROVIDER_SITE_OTHER): Payer: Commercial Managed Care - PPO | Admitting: Neurology

## 2013-02-21 ENCOUNTER — Encounter (INDEPENDENT_AMBULATORY_CARE_PROVIDER_SITE_OTHER): Payer: Self-pay

## 2013-02-21 ENCOUNTER — Encounter: Payer: Self-pay | Admitting: Neurology

## 2013-02-21 VITALS — BP 129/74 | HR 59 | Resp 16 | Ht 70.0 in | Wt 189.0 lb

## 2013-02-21 DIAGNOSIS — G40802 Other epilepsy, not intractable, without status epilepticus: Secondary | ICD-10-CM

## 2013-02-21 DIAGNOSIS — G40909 Epilepsy, unspecified, not intractable, without status epilepticus: Secondary | ICD-10-CM

## 2013-02-21 HISTORY — DX: Epilepsy, unspecified, not intractable, without status epilepticus: G40.909

## 2013-02-21 MED ORDER — DILANTIN 100 MG PO CAPS: ORAL_CAPSULE | ORAL | Status: DC

## 2013-02-21 NOTE — Patient Instructions (Signed)
Phenytoin capsules and extended-release capsules What is this medicine? PHENYTOIN (FEN i toyn) is used to control seizures in certain types of epilepsy. It is also used to prevent seizures during or after surgery. This medicine may be used for other purposes; ask your health care provider or pharmacist if you have questions. COMMON BRAND NAME(S): Dilantin, Phenytek What should I tell my health care provider before I take this medicine? They need to know if you have any of these conditions: -an alcohol abuse problem -Asian ancestry -blood disorders or disease -diabetes -heart problems -kidney disease -liver disease -porphyria -receiving radiation therapy -suicidal thoughts, plans, or attempt; a previous suicide attempt by you or a family member -thyroid disease -an unusual or allergic reaction to phenytoin, other medicines, foods, dyes, or preservatives -pregnant or trying to get pregnant -breast-feeding How should I use this medicine? Take this medicine by mouth with a glass of water. Follow the directions on the prescription label. If you are taking extended-release capsules, swallow them whole. Do not crush or chew. Take this medicine with food if it upsets your stomach. It may be best to take your medicine consistently with or without food. Take your doses at regular intervals. Do not take your medicine more often than directed. Do not stop taking this medicine suddenly. This increases the risk of seizures. Your doctor will tell you how much medicine to take. If your doctor wants you to stop the medicine, the dose will be slowly lowered over time to avoid any side effects. Talk to your pediatrician regarding the use of this medicine in children. Special care may be needed. Overdosage: If you think you have taken too much of this medicine contact a poison control center or emergency room at once. NOTE: This medicine is only for you. Do not share this medicine with others. What if I miss a  dose? If you miss a dose, take it as soon as you can. If it is almost time for your next dose, take only that dose. Do not take double or extra doses. What may interact with this medicine? Do not take this medicine with any of the following medications: -delavirdine This medicine may also interact with the following medications: -alcohol -aspirin and aspirin-like medicines -barbiturate medicines for inducing sleep or treating seizures -calcium supplements -carbamazepine -chloramphenicol -chlordiazepoxide -cimetidine or other medicines for heartburn or stomach ulcers -corticosteroid hormones such as prednisone or cortisone -diazepam -disulfiram -doxycycline -enteral feedings (liquid nutritional drinks or tube feeding liquids) -ethosuximide -male hormones, including contraceptive or birth control pills -furosemide -halothane -isoniazid -medicines for mental depression, anxiety or other mood problems -medicines to control heart rhythm -methsuximide -methylphenidate -molindone -phenylbutazone -reserpine -rifampin, rifabutin or rifapentine -sucralfate -sulfonamides like Azulfidine or Bactrim -theophylline -ticlopidine -tolbutamide -valproic acid -vitamin D -warfarin This list may not describe all possible interactions. Give your health care provider a list of all the medicines, herbs, non-prescription drugs, or dietary supplements you use. Also tell them if you smoke, drink alcohol, or use illegal drugs. Some items may interact with your medicine. What should I watch for while using this medicine? Visit your doctor or health care professional for regular checks on your progress. This medicine needs careful monitoring. Your doctor or health care professional may schedule regular blood tests. Wear a medical ID bracelet or chain, and carry a card that describes your disease and details of your medicine and dosage times. Do not change brands or dosage forms of this medicine  without discussing the change with your doctor or  health care professional. Bonita Quin may get drowsy or dizzy. Do not drive, use machinery, or do anything that needs mental alertness until you know how this medicine affects you. Do not stand or sit up quickly, especially if you are an older patient. This reduces the risk of dizzy or fainting spells. Alcohol may interfere with the effect of this medicine. Avoid alcoholic drinks. Birth control pills may not work properly while you are taking this medicine. Talk to your doctor about using an extra method of birth control. This medicine can cause unusual growth of gum tissues. Visit your dentist regularly. Problems can arise if you need dental work, and in the day to day care of your teeth. Try to avoid damage to your teeth and gums when you brush or floss your teeth. Do not take antacids at the same time as this medicine. If you get an upset stomach and want to take an antacid or medicine for diarrhea, make sure there is an interval of 2 to 3 hours before or after you took your phenytoin. The use of this medicine may increase the chance of suicidal thoughts or actions. Pay special attention to how you are responding while on this medicine. Any worsening of mood, or thoughts of suicide or dying should be reported to your health care professional right away. Women who become pregnant while using this medicine may enroll in the [place] American Antiepileptic Drug Pregnancy Registry by calling [phone]. This registry collects information about the safety of antiepileptic drug use during pregnancy. What side effects may I notice from receiving this medicine? Side effects that you should report to your doctor or health care professional as soon as possible: -allergic reactions like skin rash, itching or hives, swelling of the face, lips, or tongue -breathing problems -changes in vision -chest pain or tightness -confusion -dark yellow or brown urine -fast or  irregular heartbeat -fever, sore throat -headache -loss of seizure control -poor control of body movements or difficulty walking -redness, blistering, peeling or loosening of the skin, including inside the mouth -unusual bleeding or bruising, pinpoint red spots on skin -vomiting -worsening of mood, thoughts or actions of suicide or dying -yellowing of the eyes or skin Side effects that usually do not require medical attention (report to your doctor or health care professional if they continue or are bothersome): -constipation -difficulty sleeping -excessive hair growth on the face or body -nausea This list may not describe all possible side effects. Call your doctor for medical advice about side effects. You may report side effects to FDA at 1-800-FDA-1088. Where should I keep my medicine? Keep out of the reach of children. Store at room temperature between 15 and 30 degrees C (59 and 86 degrees F). Protect from light and moisture. Throw away any unused medicine after the expiration date. NOTE: This sheet is a summary. It may not cover all possible information. If you have questions about this medicine, talk to your doctor, pharmacist, or health care provider.  2014, Elsevier/Gold Standard. (2007-09-20 11:12:39)

## 2013-02-21 NOTE — Progress Notes (Signed)
[facility]  Provider:  Melvyn Novas, M D  Referring Provider: Kristian Covey, MD Primary Care Physician:  Kristian Covey, MD  Chief Complaint  Patient presents with  . Follow-up    Rm 10    Transfer of care from Dr. Sandria Manly, for seizure.  HPI:  Curtis Sheppard is a 57 y.o. male  Is seen here as a referral/ revisit  from Dr. Caryl Never for  Dilantin -Refill, treatment of a single,  Remote seizure in April 2004.   The patient reports that he has been on Dilantin is his first and only drug in the treatment of his single seizure. His Dilantin levels have been routinely checked by his primary care physician and Dr. love prefer them to be between 10 and 20 and father in the upper range of normal. Regular comprehensive metabolic profiles and blood cell counts were also normal in the past the patient never had neurologic complications nor or her and all other seizure. Since his workplace requires him to drive about an hour back to and from, he felt safer staying on Dilantin to satisfy the [facility] requirements.  This single isolated seizure event occurred on the job when he felt dizzy and remembered waking up in the hospital was a complete amnesia for the event itself.  EEGs and MRIs at that time were negative. Dr. Sandria Manly reports at that he had additionally undergone sleep deprivation EEG studies that I have today not available.  He has not tolerated change to generic Dilantin and remains now on Brand Name only.  200 mg in AM and 300 mg in PM.    Family history : schizophrenia in a younger brother. Daughter with seizures.    Review of Systems: Out of a complete 14 system review, the patient complains of only the following symptoms, and all other reviewed systems are negative. None,  Daytime sleepiness. Epworth 9 .     History   Social History  . Marital Status: Married    Spouse Name: tracy    Number of Children: 2  . Years of Education: college   Occupational  History  . [employer]    Social History Main Topics  . Smoking status: Never Smoker   . Smokeless tobacco: Not on file  . Alcohol Use: Yes  . Drug Use: No  . Sexual Activity: Not on file   Other Topics Concern  . Not on file   Social History Narrative  . No narrative on file    Family History  Problem Relation Age of Onset  . Cancer Mother     lung  . Cancer Father     lung  . Bipolar disorder Brother     paranoid manic, depressive bipolar  . Heart disease Brother 79    MI  . Dementia Maternal Grandmother   . Stroke Paternal Grandfather     Past Medical History  Diagnosis Date  . Hyperlipidemia   . Seizures   . UTI (urinary tract infection)     Past Surgical History  Procedure Laterality Date  . Nasal septum surgery  1975    deviated    Current Outpatient Prescriptions  Medication Sig Dispense Refill  . Calcium Carbonate-Vit D-Min (CALCIUM 1200 PO) Take 1 tablet by mouth daily.       . colchicine 0.6 MG tablet Take 1 tablet (0.6 mg total) by mouth 2 (two) times daily as needed. For gout.  60 tablet  3  . DILANTIN 100 MG ER capsule  Take 2 capsules po in the morning and take 3 capsules po qhs  150 capsule  0  . Omega-3 Fatty Acids (FISH OIL) 1200 MG CAPS Take 2 capsules by mouth 2 (two) times daily.       . simvastatin (ZOCOR) 20 MG tablet Take 1 tablet (20 mg total) by mouth at bedtime.  90 tablet  3  . zolpidem (AMBIEN) 10 MG tablet Take 1 tablet (10 mg total) by mouth at bedtime as needed. For sleep  30 tablet  0   No current facility-administered medications for this visit.    Allergies as of 02/21/2013 - Review Complete 02/21/2013  Allergen Reaction Noted  . Penicillins Other (See Comments) 09/25/2010    Vitals: BP 129/74  Pulse 59  Resp 16  Ht 5\' 10"  (1.778 m)  Wt 189 lb (85.73 kg)  BMI 27.12 kg/m2 Last Weight:  Wt Readings from Last 1 Encounters:  02/21/13 189 lb (85.73 kg)   Last Height:   Ht Readings from Last 1  Encounters:  02/21/13 5\' 10"  (1.778 m)   Physical exam:  General: The patient is awake, alert and appears not in acute distress. The patient is well groomed. Head: Normocephalic, atraumatic. Neck is supple. Mallampati 3 , neck circumference:15 inches.  Cardiovascular:  Regular rate and rhythm , without  murmurs or carotid bruit, and without distended neck veins. No retrognathia , no TMJ.  Respiratory: Lungs are clear to auscultation. Skin:  Without evidence of edema, or rash Trunk: BMI is normal , as is posture.  Neurologic exam : The patient is awake and alert, oriented to place and time.  Memory subjective   described as intact. There is a normal attention span & concentration ability.  Speech is fluent without dysarthria, dysphonia or aphasia. Mood and affect are appropriate.  Cranial nerves: Pupils are equal and briskly reactive to light.  Funduscopic exam without  evidence of pallor or edema. Extraocular movements  in vertical and horizontal planes intact and with a dilantin - nystagmus.  Visual fields by finger perimetry are intact. Iris left side with a frackle , at 5 o'clock.  Hearing to finger rub intact.  Facial sensation intact to fine touch. Facial motor strength is symmetric and tongue and uvula move midline.  Motor exam:   Normal tone and muscle bulk and symmetric strength in all extremities.  Sensory:  Fine touch, pinprick and vibration were tested in all extremities.  Proprioception is normal.  Coordination: Rapid alternating movements in the fingers/hands is tested and normal. Finger-to-nose maneuver tested and normal without evidence of ataxia, dysmetria or tremor.  Gait and station: Patient walks without assistive device  Strength within normal limits. Stance is stable and normal. Tandem gait is  unfragmented. Romberg testing is normal.  Deep tendon reflexes: in the  upper and lower extremities are symmetric and intact. Babinski maneuver downgoing.   Assessment:   After physical and neurologic examination, review of laboratory studies, imaging, neurophysiology testing and pre-existing records, assessment is :  Single seizure , would not need  a lifelong committment to medication, but has been taken it because of DMV rules, he cannot afford not to drive for 6 month.   Plan:  Treatment plan and additional workup : Dilantin Brand name  100 mg , 2 in AM and 3 in PM.   Labs reviewed on Epic , yearly full physical will be performed with Dr. Caryl Never.  Patient is a high metabolizer for this zero-kinetik drug , last level was 5.9 ,  low .  Patient has no skin changes , hypo or hyperpigmentations.

## 2013-03-21 ENCOUNTER — Other Ambulatory Visit (INDEPENDENT_AMBULATORY_CARE_PROVIDER_SITE_OTHER): Payer: Commercial Managed Care - PPO

## 2013-03-21 DIAGNOSIS — Z8669 Personal history of other diseases of the nervous system and sense organs: Secondary | ICD-10-CM

## 2013-03-21 DIAGNOSIS — Z0000 Encounter for general adult medical examination without abnormal findings: Secondary | ICD-10-CM

## 2013-03-21 LAB — CBC WITH DIFFERENTIAL/PLATELET
Basophils Absolute: 0 10*3/uL (ref 0.0–0.1)
Basophils Relative: 0.4 % (ref 0.0–3.0)
Eosinophils Absolute: 0.3 10*3/uL (ref 0.0–0.7)
Eosinophils Relative: 5.6 % — ABNORMAL HIGH (ref 0.0–5.0)
HCT: 44.5 % (ref 39.0–52.0)
Hemoglobin: 15.7 g/dL (ref 13.0–17.0)
Lymphocytes Relative: 28.4 % (ref 12.0–46.0)
Lymphs Abs: 1.4 10*3/uL (ref 0.7–4.0)
MCHC: 35.2 g/dL (ref 30.0–36.0)
MCV: 94 fl (ref 78.0–100.0)
Monocytes Absolute: 0.4 10*3/uL (ref 0.1–1.0)
Monocytes Relative: 7.3 % (ref 3.0–12.0)
Neutro Abs: 2.9 10*3/uL (ref 1.4–7.7)
Neutrophils Relative %: 58.3 % (ref 43.0–77.0)
Platelets: 180 10*3/uL (ref 150.0–400.0)
RBC: 4.74 Mil/uL (ref 4.22–5.81)
RDW: 13.5 % (ref 11.5–14.6)
WBC: 4.9 10*3/uL (ref 4.5–10.5)

## 2013-03-21 LAB — POCT URINALYSIS DIPSTICK
Bilirubin, UA: NEGATIVE
Blood, UA: NEGATIVE
Glucose, UA: NEGATIVE
Ketones, UA: NEGATIVE
Leukocytes, UA: NEGATIVE
Nitrite, UA: NEGATIVE
Protein, UA: NEGATIVE
Spec Grav, UA: 1.025
Urobilinogen, UA: 0.2
pH, UA: 5.5

## 2013-03-21 LAB — LIPID PANEL
Cholesterol: 237 mg/dL — ABNORMAL HIGH (ref 0–200)
HDL: 76.5 mg/dL (ref >39.00)
Total CHOL/HDL Ratio: 3
Triglycerides: 155 mg/dL — ABNORMAL HIGH (ref 0.0–149.0)
VLDL: 31 mg/dL (ref 0.0–40.0)

## 2013-03-21 LAB — TSH: TSH: 1.77 u[IU]/mL (ref 0.35–5.50)

## 2013-03-21 LAB — BASIC METABOLIC PANEL
BUN: 16 mg/dL (ref 6–23)
CO2: 28 mEq/L (ref 19–32)
Calcium: 9.2 mg/dL (ref 8.4–10.5)
Chloride: 106 mEq/L (ref 96–112)
Creatinine, Ser: 0.9 mg/dL (ref 0.4–1.5)
GFR: 113.87 mL/min (ref >60.00)
Glucose, Bld: 101 mg/dL — ABNORMAL HIGH (ref 70–99)
Potassium: 5.1 mEq/L (ref 3.5–5.1)
Sodium: 140 mEq/L (ref 135–145)

## 2013-03-21 LAB — HEPATIC FUNCTION PANEL
ALT: 26 U/L (ref 0–53)
AST: 21 U/L (ref 0–37)
Albumin: 4.5 g/dL (ref 3.5–5.2)
Alkaline Phosphatase: 53 U/L (ref 39–117)
Bilirubin, Direct: 0.1 mg/dL (ref 0.0–0.3)
Total Bilirubin: 0.7 mg/dL (ref 0.3–1.2)
Total Protein: 7.3 g/dL (ref 6.0–8.3)

## 2013-03-21 LAB — PSA: PSA: 3.49 ng/mL (ref 0.10–4.00)

## 2013-03-21 LAB — LDL CHOLESTEROL, DIRECT: Direct LDL: 140.8 mg/dL

## 2013-03-22 LAB — PHENYTOIN LEVEL, TOTAL: Phenytoin Lvl: 28.3 ug/mL — ABNORMAL HIGH (ref 10.0–20.0)

## 2013-03-28 ENCOUNTER — Encounter: Payer: Self-pay | Admitting: Family Medicine

## 2013-03-28 ENCOUNTER — Ambulatory Visit (INDEPENDENT_AMBULATORY_CARE_PROVIDER_SITE_OTHER): Payer: Commercial Managed Care - PPO | Admitting: Family Medicine

## 2013-03-28 VITALS — BP 132/80 | HR 68 | Temp 97.6°F | Ht 70.0 in | Wt 192.0 lb

## 2013-03-28 DIAGNOSIS — Z0000 Encounter for general adult medical examination without abnormal findings: Secondary | ICD-10-CM

## 2013-03-28 NOTE — Progress Notes (Signed)
Pre visit review using our clinic review tool, if applicable. No additional management support is needed unless otherwise documented below in the visit note. 

## 2013-03-28 NOTE — Progress Notes (Signed)
   Subjective:    Patient ID: Curtis Sheppard, male    DOB: [birth year], 58 y.o.   MRN: [id number]  HPI  Patient seen for complete physical. He has history of dyslipidemia, history of seizures, and history of gout. No recent gout flareups. Remains on simvastatin for hyperlipidemia. No history of CAD or peripheral vascular disease.. Colonoscopy up to date. Immunizations up-to-date. Nonsmoker. He walks for exercise.  He continues to see urologist regarding his seizure disorder. Denies seizure in several years. He remains on Dilantin.  Past Medical History  Diagnosis Date  . Hyperlipidemia   . Seizures   . UTI (urinary tract infection)   . Epileptic seizure 02/21/2013    Single event.    Past Surgical History  Procedure Laterality Date  . Nasal septum surgery  1975    deviated    reports that he has never smoked. He does not have any smokeless tobacco history on file. He reports that he drinks alcohol. He reports that he does not use illicit drugs. family history includes Bipolar disorder in his brother; Cancer in his father and mother; Dementia in his maternal grandmother; Heart disease (age of onset: 15) in his brother; Stroke in his paternal grandfather. Allergies  Allergen Reactions  . Penicillins Other (See Comments)    Unknown      Review of Systems  Constitutional: Negative for fever, activity change, appetite change and fatigue.  HENT: Negative for congestion, ear pain and trouble swallowing.   Eyes: Negative for pain and visual disturbance.  Respiratory: Negative for cough, shortness of breath and wheezing.   Cardiovascular: Negative for chest pain and palpitations.  Gastrointestinal: Negative for nausea, vomiting, abdominal pain, diarrhea, constipation, blood in stool, abdominal distention and rectal pain.  Genitourinary: Negative for dysuria, hematuria and testicular pain.  Musculoskeletal: Negative for arthralgias and joint swelling.  Skin: Negative for rash.    Neurological: Negative for dizziness, syncope and headaches.  Hematological: Negative for adenopathy.  Psychiatric/Behavioral: Negative for confusion and dysphoric mood.       Objective:   Physical Exam  Constitutional: He is oriented to person, place, and time. He appears well-developed and well-nourished. No distress.  HENT:  Head: Normocephalic and atraumatic.  Right Ear: External ear normal.  Left Ear: External ear normal.  Mouth/Throat: Oropharynx is clear and moist.  Eyes: Conjunctivae and EOM are normal. Pupils are equal, round, and reactive to light.  Neck: Normal range of motion. Neck supple. No thyromegaly present.  Cardiovascular: Normal rate, regular rhythm and normal heart sounds.   No murmur heard. Pulmonary/Chest: No respiratory distress. He has no wheezes. He has no rales.  Abdominal: Soft. Bowel sounds are normal. He exhibits no distension and no mass. There is no tenderness. There is no rebound and no guarding.  Genitourinary: Rectum normal and prostate normal.  Musculoskeletal: He exhibits no edema.  Lymphadenopathy:    He has no cervical adenopathy.  Neurological: He is alert and oriented to person, place, and time. He displays normal reflexes. No cranial nerve deficit.  Skin: No rash noted.  Psychiatric: He has a normal mood and affect.          Assessment & Plan:  Complete physical. Labs reviewed. He has technically prediabetes and we have recommended weight loss and continue regular exercise. His Dilantin level was slightly high and we will forward this to his neurologist. He has elevated cholesterol but excellent HDL.

## 2013-03-29 ENCOUNTER — Telehealth: Payer: Self-pay | Admitting: Neurology

## 2013-03-29 DIAGNOSIS — Z79899 Other long term (current) drug therapy: Secondary | ICD-10-CM

## 2013-03-29 DIAGNOSIS — G40909 Epilepsy, unspecified, not intractable, without status epilepticus: Secondary | ICD-10-CM

## 2013-03-29 NOTE — Telephone Encounter (Signed)
Former dr Erling Cruz --Cecille Rubin / Patients blood draw was at 1.30 Pm. The patient takes a lot- 300 mg in AM and 200 mg in PM .Brand name only .  Reduction could be by introducing the chewable 50 mg dose and replacing one 100 mg capsule Dilantin if patient had truly taken AM dose within just 3 hours prior to blood draw.Marland Kitchen

## 2013-03-29 NOTE — Telephone Encounter (Signed)
Please check and see if patient took Dilantin before lab draw. Not sure why this was sent to me

## 2013-03-29 NOTE — Telephone Encounter (Signed)
Dr Anastasio Auerbach recent labs show dilantin level at over 23 mmol/dl. It is not clear to me if this was a trough level or  Taken right after he took medication. Please find out and arrange a Rv with Np .

## 2013-04-04 NOTE — Telephone Encounter (Signed)
I last saw this patient 01/05/2012. I will update his meds in the system and he needs a repeat trough level first thing in the am any day this week. Please call the patient. If  meds changed it is up to the provider in this case Dr. Brett Fairy to make changes in the system. This is how med errors occur.  He was seen by her in December.  The order is in the system

## 2013-04-04 NOTE — Telephone Encounter (Signed)
Spoke with patient to get clarification on when he took his Dilantin prior to blood draw.  Patient does not take Dilantin 100mg  2 in the morning and 3 at bedtime as the prescription is written.  He said it makes him too tired.  He takes 4 tabs total, and he takes them all at night so he is not fatigued during the day.  The day of the lab test, he took 4 tabs the night before at 10pm, and had his labs drawn at 0930.  He said he has had a high level before.

## 2013-04-07 NOTE — Telephone Encounter (Signed)
Left message for patient to come and get another Dilantin level done.   I will forward to Dr. Brett Fairy and relay that the patient said he is taking Dilantin 100mg  4 tabs at night.

## 2013-04-11 NOTE — Telephone Encounter (Signed)
Thank you for clarifying the dose, 400 mg dilantin

## 2013-04-13 ENCOUNTER — Other Ambulatory Visit: Payer: Self-pay | Admitting: *Deleted

## 2013-04-13 ENCOUNTER — Encounter: Payer: Self-pay | Admitting: Family Medicine

## 2013-04-13 ENCOUNTER — Ambulatory Visit (INDEPENDENT_AMBULATORY_CARE_PROVIDER_SITE_OTHER): Payer: Self-pay

## 2013-04-13 DIAGNOSIS — Z0289 Encounter for other administrative examinations: Secondary | ICD-10-CM

## 2013-04-13 DIAGNOSIS — Z79899 Other long term (current) drug therapy: Secondary | ICD-10-CM

## 2013-04-13 NOTE — Telephone Encounter (Signed)
High dilantin.

## 2013-04-14 ENCOUNTER — Other Ambulatory Visit: Payer: Self-pay | Admitting: *Deleted

## 2013-04-14 DIAGNOSIS — Z79899 Other long term (current) drug therapy: Secondary | ICD-10-CM

## 2013-04-14 DIAGNOSIS — G40909 Epilepsy, unspecified, not intractable, without status epilepticus: Secondary | ICD-10-CM

## 2013-04-17 LAB — PHENYTOIN LEVEL, FREE
Phenytoin, Free: 1 ug/mL (ref 1.0–2.0)
Phenytoin: 14.6 ug/mL (ref 10.0–20.0)

## 2013-04-21 NOTE — Progress Notes (Signed)
Quick Note:  Spoke to patient and relayed dilantin level therapeutic, per Dr. Brett Fairy. ______

## 2013-07-19 ENCOUNTER — Telehealth: Payer: Self-pay | Admitting: Nurse Practitioner

## 2013-07-19 NOTE — Telephone Encounter (Signed)
Patient is calling to get a discount Dilantin card--please call--thank you.

## 2013-07-19 NOTE — Telephone Encounter (Signed)
These now have to be obtained at [email or web address].  I called back, got no answer.  Left message.

## 2013-09-23 ENCOUNTER — Other Ambulatory Visit: Payer: Self-pay | Admitting: Family Medicine

## 2013-11-10 ENCOUNTER — Encounter: Payer: Self-pay | Admitting: Neurology

## 2014-01-15 ENCOUNTER — Encounter: Payer: Self-pay | Admitting: Family Medicine

## 2014-01-15 ENCOUNTER — Ambulatory Visit (INDEPENDENT_AMBULATORY_CARE_PROVIDER_SITE_OTHER): Payer: Commercial Managed Care - PPO | Admitting: Family Medicine

## 2014-01-15 VITALS — BP 130/80 | HR 70 | Temp 97.3°F | Wt 188.0 lb

## 2014-01-15 DIAGNOSIS — S61219D Laceration without foreign body of unspecified finger without damage to nail, subsequent encounter: Secondary | ICD-10-CM

## 2014-01-15 DIAGNOSIS — S63259D Unspecified dislocation of unspecified finger, subsequent encounter: Secondary | ICD-10-CM

## 2014-01-15 NOTE — Patient Instructions (Signed)
Continue to clean wounds daily with soap and water Continue with topical antibiotic and keep covered for another week or until healed Follow up for any signs of infection such as redness, warmth, or pus-like drainage.

## 2014-01-15 NOTE — Progress Notes (Signed)
   Subjective:    Patient ID: Curtis Sheppard, male    DOB: [birth year], 58 y.o.   MRN: [id number]  HPI ER follow-up. Patient is up in [place] one week ago today injury right hand involving fourth and fifth digits. He was using a circular saw and basically mangled the tips of his right fourth and fifth digits. Minimal nail injury fourth digit. He also describes a possible dislocation PIP joint fifth digit which was relocated. Tetanus up-to-date. Patient had 3 sutures fifth digit and 5 sutures fourth digit.  placed on Keflex. Changing dressing daily. No signs of secondary infection. No erythema. No purulent drainage. No fevers or chills. Minimal pain  Past Medical History  Diagnosis Date  . Hyperlipidemia   . Seizures   . UTI (urinary tract infection)   . Epileptic seizure 02/21/2013    Single event.    Past Surgical History  Procedure Laterality Date  . Nasal septum surgery  1975    deviated    reports that he has never smoked. He does not have any smokeless tobacco history on file. He reports that he drinks alcohol. He reports that he does not use illicit drugs. family history includes Bipolar disorder in his brother; Cancer in his father and mother; Dementia in his maternal grandmother; Heart disease (age of onset: 60) in his brother; Stroke in his paternal grandfather. Allergies  Allergen Reactions  . Penicillins Other (See Comments)    Unknown       Review of Systems  Constitutional: Negative for fever and chills.  Respiratory: Negative for shortness of breath.   Neurological: Negative for weakness and numbness.       Objective:   Physical Exam  Constitutional: He appears well-developed and well-nourished.  Cardiovascular: Normal rate and regular rhythm.   Musculoskeletal:  Right fifth digit reveals some minimal swelling PIP joint but no bony tenderness. He has somewhat limited range of motion PIP and DIP joint probably related to stiffness from recent dressing on  finger. He does have some flexion-extension of both joints. He has mangled lacerations fourth and fifth digits. No erythema or drainage to suggest secondary infection. Total of 8 sutures removed without difficulty          Assessment & Plan:  #1 complex lacerations right fourth and fifth digits. Finish out Keflex. Sutures removed. Continue good local wound care and follow-up promptly for signs of secondary fraction #2 reported dislocation right fifth digit PIP joint. We've recommended splinting this in about 30 flexion for 1 more week and then leaving off splint. Then work on range of motion

## 2014-01-15 NOTE — Progress Notes (Signed)
Pre visit review using our clinic review tool, if applicable. No additional management support is needed unless otherwise documented below in the visit note. 

## 2014-01-17 ENCOUNTER — Encounter: Payer: Self-pay | Admitting: Neurology

## 2014-01-23 ENCOUNTER — Encounter: Payer: Self-pay | Admitting: Neurology

## 2014-02-21 ENCOUNTER — Ambulatory Visit: Payer: Commercial Managed Care - PPO | Admitting: Neurology

## 2014-03-07 ENCOUNTER — Ambulatory Visit (INDEPENDENT_AMBULATORY_CARE_PROVIDER_SITE_OTHER): Payer: Commercial Managed Care - PPO | Admitting: Neurology

## 2014-03-07 ENCOUNTER — Encounter: Payer: Self-pay | Admitting: Neurology

## 2014-03-07 VITALS — BP 123/73 | HR 71 | Temp 97.9°F | Resp 16 | Ht 69.5 in | Wt 189.5 lb

## 2014-03-07 DIAGNOSIS — G4700 Insomnia, unspecified: Secondary | ICD-10-CM

## 2014-03-07 DIAGNOSIS — R569 Unspecified convulsions: Secondary | ICD-10-CM

## 2014-03-07 DIAGNOSIS — F5105 Insomnia due to other mental disorder: Secondary | ICD-10-CM

## 2014-03-07 MED ORDER — ZOLPIDEM TARTRATE 10 MG PO TABS: 10.0000 mg | ORAL_TABLET | Freq: Every evening | ORAL | Status: DC | PRN

## 2014-03-07 NOTE — Progress Notes (Signed)
[facility]  Provider:  Larey Seat, M D  Referring Provider: Eulas Post, MD Primary Care Physician:  Eulas Post, MD  Chief Complaint  Patient presents with  . Seizures    Room 11    Transfer of care from Dr. Erling Cruz, for seizure.  HPI:  Curtis Sheppard is a 59 y.o. male  Is seen here as a referral/ revisit  from Dr. Elease Hashimoto for  Dilantin -Refill, treatment of a single,   Remote seizure in April 2004.  followed by Geronimo Running, NP. :   The patient reports that he has been on Dilantin,  his first and only drug in the treatment of his single seizure.  His Dilantin levels have been routinely checked by his primary care physician and Dr. love prefer them to be between 45 and 55 and father in the upper range of normal. Regular comprehensive metabolic profiles and blood cell counts were also normal in the past the patient never had neurologic complications nor or her and all other seizure. Since his workplace requires him to drive about an hour back to and from, he felt safer staying on Dilantin to satisfy the [facility] requirements. This single isolated seizure event occurred on the job when he felt dizzy and remembered waking up in the hospital was a complete amnesia for the event itself.  EEGs and MRIs at that time were negative.  Dr. Erling Cruz reports at that he had additionally undergone sleep deprivation EEG studies that I have today not available.  He has not tolerated change to generic Dilantin and remains now on Brand Name only.  200 mg in AM and 300 mg in PM.    03-07-14   Interval history ; patient here for refills. This patient has been treated with Dilantin for a seizure event isolated in the year 2004 in these almost 12 years he had no recurrency. He is concerned about changing or weaning because he needs to drive and a change in medication or weaning off seizure medication would mean a six-month driving restriction but she cannot afford at this  time. He notices that he sometimes has insomnia and in both cases he takes Ambien if he doesn't sleep he feels it feels excessively daytime sleepy but he does not contribute any fatigue sleepiness or ataxia to Dilantin. He is using 400 mg Dilantin per day in a brand name only preparation. A change to a generic form was not well tolerated. In addition this patient suffers from gout and takes colchicine he also takes Zocor for hyperlipidemia fish oil and calcium carbonate.   I recommended today to try a multivitamin in addition to whatever he wants to take aside from that I like the calcium and vitamin D preparation but a vitamin B complex would also be beneficial for chronic Dilantin use    Family history :  schizophrenia in a younger brother.  One Daughter with seizures.    Review of Systems: Out of a complete 14 system review, the patient complains of only the following symptoms, and all other reviewed systems are negative. 03-07-14 CD Daytime sleepiness. Epworth 8  FSS  12, GDS 2 ,   MMSE  Deferred    Blood work in 14 days for annual physical. Dr. Carolann Littler .    History   Social History  . Marital Status: Married    Spouse Name: tracy    Number of Children: 2  . Years of Education: college   Occupational History  .  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 Smoking status: Never Smoker   . Smokeless tobacco: Not on file  . Alcohol Use: Yes  . Drug Use: No  . Sexual Activity: Not on file   Other Topics Concern  . Not on file   Social History Narrative    Family History  Problem Relation Age of Onset  . Cancer Mother     lung  . Cancer Father     lung  . Bipolar disorder Brother     paranoid manic, depressive bipolar  . Heart disease Brother 31    MI  . Dementia Maternal Grandmother   . Stroke Paternal Grandfather     Past Medical History  Diagnosis Date  . Hyperlipidemia   . Seizures   . UTI (urinary tract infection)   . Epileptic  seizure 02/21/2013    Single event.     Past Surgical History  Procedure Laterality Date  . Nasal septum surgery  1975    deviated    Current Outpatient Prescriptions  Medication Sig Dispense Refill  . Calcium Carbonate-Vit D-Min (CALCIUM 1200 PO) Take 1 tablet by mouth daily.     . colchicine 0.6 MG tablet Take 1 tablet (0.6 mg total) by mouth 2 (two) times daily as needed. For gout. 60 tablet 3  . Omega-3 Fatty Acids (FISH OIL) 1200 MG CAPS Take 2 capsules by mouth 2 (two) times daily.     . phenytoin (DILANTIN) 100 MG ER capsule Take 400 mg by mouth at bedtime.    . simvastatin (ZOCOR) 20 MG tablet TAKE 1 TABLET BY MOUTH ATBEDTIME. 90 tablet 1  . zolpidem (AMBIEN) 10 MG tablet Take 1 tablet (10 mg total) by mouth at bedtime as needed. For sleep 30 tablet 0   No current facility-administered medications for this visit.    Allergies as of 03/07/2014 - Review Complete 03/07/2014  Allergen Reaction Noted  . Penicillins Other (See Comments) 09/25/2010    Vitals: BP 123/73 mmHg  Pulse 71  Temp(Src) 97.9 F (36.6 C) (Oral)  Resp 16  Ht 5' 9.5" (1.765 m)  Wt 189 lb 8 oz (85.957 kg)  BMI 27.59 kg/m2 Last Weight:  Wt Readings from Last 1 Encounters:  03/07/14 189 lb 8 oz (85.957 kg)   Last Height:   Ht Readings from Last 1 Encounters:  03/07/14 5' 9.5" (1.765 m)   Physical exam:  General: The patient is awake, alert and appears not in acute distress. The patient is well groomed. Head: Normocephalic, atraumatic. Neck is supple. Mallampati 3 , neck circumference:15 inches.  Cardiovascular:  Regular rate and rhythm , without  murmurs or carotid bruit, and without distended neck veins. No retrognathia , no TMJ.  Respiratory: Lungs are clear to auscultation. Skin:  Without evidence of edema, or rash Trunk: BMI is normal , as is posture.  Neurologic exam : The patient is awake and alert, oriented to place and time.  Memory subjective   described as intact. There is a normal  attention span & concentration ability.  Speech is fluent without dysarthria, dysphonia or aphasia. Mood and affect are appropriate.  Cranial nerves: Pupils are equal and briskly reactive to light.  Funduscopic exam without  evidence of pallor or edema. Extraocular movements  in vertical and horizontal planes intact and with a dilantin - nystagmus.  Visual fields by finger perimetry are intact. Iris left side with a frackle , at 5 o'clock.- no diplopia since he  got  his detached retina fixed  Hearing to finger rub intact. Facial sensation intact to fine touch. Facial motor strength is symmetric and tongue and uvula move midline.  Motor exam:  Normal tone and muscle bulk and symmetric strength in all extremities. No cog wheeling, no spasticity.  The patient had an accident with a table saw and cut into his pinky on the right hand.  Sensory:  Fine touch, pinprick and vibration were normal. No evidence of dilantin neuropathy.  Coordination: Rapid alternating movements in the fingers/hands  and Finger-to-nose maneuver tested and normal without evidence of ataxia, dysmetria or tremor. No evidence of cerebellar  Ataxia.   Gait and station: Patient walks without assistive device ,  Strength within normal limits. Stance is stable and normal. Tandem gait is unfragmented. Deep tendon reflexes: in the  upper and lower extremities are symmetric and intact. Babinski maneuver downgoing.   Assessment:  After physical and neurologic examination, review of laboratory studies, imaging, neurophysiology testing and pre-existing records, assessment is :   This established patient seen here for yearly follow up upon one Single seizure in 2004  ,  He would not need a lifelong committment to medication, but has been taken it because of DMV rules, he cannot afford not to drive for 6 month.   Plan:  Treatment plan and additional workup : Dilantin Brand name  100 mg , 2 in AM and 3 in PM.   Labs reviewed on Epic ,  yearly full physical will be performed with Dr. Elease Hashimoto.  Patient is a high metabolizer for this zero-kinetik drug , last level was 5.9 , low .  Patient has no skin changes , hypo or hyperpigmentations.

## 2014-03-22 ENCOUNTER — Other Ambulatory Visit (INDEPENDENT_AMBULATORY_CARE_PROVIDER_SITE_OTHER): Payer: Commercial Managed Care - PPO

## 2014-03-22 ENCOUNTER — Other Ambulatory Visit (INDEPENDENT_AMBULATORY_CARE_PROVIDER_SITE_OTHER): Payer: Commercial Managed Care - PPO | Admitting: *Deleted

## 2014-03-22 DIAGNOSIS — Z0000 Encounter for general adult medical examination without abnormal findings: Secondary | ICD-10-CM

## 2014-03-22 LAB — CBC WITH DIFFERENTIAL/PLATELET
Basophils Absolute: 0 10*3/uL (ref 0.0–0.1)
Basophils Relative: 1 % (ref 0.0–3.0)
Eosinophils Absolute: 0.3 10*3/uL (ref 0.0–0.7)
Eosinophils Relative: 7.8 % — ABNORMAL HIGH (ref 0.0–5.0)
HCT: 41.6 % (ref 39.0–52.0)
Hemoglobin: 14.8 g/dL (ref 13.0–17.0)
Lymphocytes Relative: 39.1 % (ref 12.0–46.0)
Lymphs Abs: 1.6 10*3/uL (ref 0.7–4.0)
MCHC: 35.4 g/dL (ref 30.0–36.0)
MCV: 93 fl (ref 78.0–100.0)
Monocytes Absolute: 0.4 10*3/uL (ref 0.1–1.0)
Monocytes Relative: 9.3 % (ref 3.0–12.0)
Neutro Abs: 1.8 10*3/uL (ref 1.4–7.7)
Neutrophils Relative %: 42.8 % — ABNORMAL LOW (ref 43.0–77.0)
Platelets: 164 10*3/uL (ref 150.0–400.0)
RBC: 4.48 Mil/uL (ref 4.22–5.81)
RDW: 13.5 % (ref 11.5–15.5)
WBC: 4.2 10*3/uL (ref 4.0–10.5)

## 2014-03-22 LAB — COMPREHENSIVE METABOLIC PANEL
ALT: 34 U/L (ref 0–53)
AST: 21 U/L (ref 0–37)
Albumin: 4.3 g/dL (ref 3.5–5.2)
Alkaline Phosphatase: 66 U/L (ref 39–117)
BUN: 16 mg/dL (ref 6–23)
CO2: 28 mEq/L (ref 19–32)
Calcium: 9 mg/dL (ref 8.4–10.5)
Chloride: 104 mEq/L (ref 96–112)
Creatinine, Ser: 0.92 mg/dL (ref 0.40–1.50)
GFR: 89.1 mL/min (ref >60.00)
Glucose, Bld: 108 mg/dL — ABNORMAL HIGH (ref 70–99)
Potassium: 4.4 mEq/L (ref 3.5–5.1)
Sodium: 139 mEq/L (ref 135–145)
Total Bilirubin: 0.5 mg/dL (ref 0.2–1.2)
Total Protein: 6.6 g/dL (ref 6.0–8.3)

## 2014-03-22 LAB — POCT URINALYSIS DIPSTICK
Bilirubin, UA: NEGATIVE
Blood, UA: NEGATIVE
Glucose, UA: NEGATIVE
Ketones, UA: NEGATIVE
Leukocytes, UA: NEGATIVE
Nitrite, UA: NEGATIVE
Protein, UA: NEGATIVE
Spec Grav, UA: 1.015
Urobilinogen, UA: 0.2
pH, UA: 7

## 2014-03-22 LAB — LIPID PANEL
Cholesterol: 204 mg/dL — ABNORMAL HIGH (ref 0–200)
HDL: 58.7 mg/dL (ref >39.00)
LDL Cholesterol: 118 mg/dL — ABNORMAL HIGH (ref 0–99)
NonHDL: 145.3
Total CHOL/HDL Ratio: 3
Triglycerides: 135 mg/dL (ref 0.0–149.0)
VLDL: 27 mg/dL (ref 0.0–40.0)

## 2014-03-22 LAB — PSA: PSA: 4.43 ng/mL — ABNORMAL HIGH (ref 0.10–4.00)

## 2014-03-22 LAB — TSH: TSH: 1.88 u[IU]/mL (ref 0.35–4.50)

## 2014-03-23 LAB — PHENYTOIN LEVEL, TOTAL: Phenytoin Lvl: 18.4 ug/mL (ref 10.0–20.0)

## 2014-03-29 ENCOUNTER — Ambulatory Visit (INDEPENDENT_AMBULATORY_CARE_PROVIDER_SITE_OTHER): Payer: Commercial Managed Care - PPO | Admitting: Family Medicine

## 2014-03-29 ENCOUNTER — Encounter: Payer: Self-pay | Admitting: Family Medicine

## 2014-03-29 VITALS — BP 130/76 | HR 66 | Temp 97.9°F | Ht 69.5 in | Wt 192.0 lb

## 2014-03-29 DIAGNOSIS — R972 Elevated prostate specific antigen [PSA]: Secondary | ICD-10-CM

## 2014-03-29 DIAGNOSIS — Z0000 Encounter for general adult medical examination without abnormal findings: Secondary | ICD-10-CM

## 2014-03-29 NOTE — Patient Instructions (Addendum)
Check on coverage for Shingles vaccine Repeat PSA in about 4 months.

## 2014-03-29 NOTE — Progress Notes (Signed)
Pre visit review using our clinic review tool, if applicable. No additional management support is needed unless otherwise documented below in the visit note. 

## 2014-03-29 NOTE — Progress Notes (Signed)
Subjective:    Patient ID: Curtis Sheppard, male    DOB: [birth year], 59 y.o.   MRN: [id number]  HPI Patient for complete physical. He has remote history of seizure 2003 but none since then. Is maintained on Dilantin. No side effects. No history of shingles vaccine. Colonoscopy 2008. Immunizations up-to-date. Never smoked. He has somewhat scaly verrucous type rash left groin which occasionally itches. No other complaints  Past Medical History  Diagnosis Date  . Hyperlipidemia   . Seizures   . UTI (urinary tract infection)   . Epileptic seizure 02/21/2013    Single event.    Past Surgical History  Procedure Laterality Date  . Nasal septum surgery  1975    deviated    reports that he has never smoked. He does not have any smokeless tobacco history on file. He reports that he drinks alcohol. He reports that he does not use illicit drugs. family history includes Bipolar disorder in his brother; Cancer in his father and mother; Dementia in his maternal grandmother; Heart disease (age of onset: 38) in his brother; Stroke in his paternal grandfather. Allergies  Allergen Reactions  . Penicillins Other (See Comments)    Unknown       Review of Systems  Constitutional: Negative for fever, activity change, appetite change and fatigue.  HENT: Negative for congestion, ear pain and trouble swallowing.   Eyes: Negative for pain and visual disturbance.  Respiratory: Negative for cough, shortness of breath and wheezing.   Cardiovascular: Negative for chest pain and palpitations.  Gastrointestinal: Negative for nausea, vomiting, abdominal pain, diarrhea, constipation, blood in stool, abdominal distention and rectal pain.  Genitourinary: Negative for dysuria, hematuria and testicular pain.  Musculoskeletal: Negative for joint swelling and arthralgias.  Skin: Positive for rash.  Neurological: Negative for dizziness, syncope and headaches.  Hematological: Negative for adenopathy.    Psychiatric/Behavioral: Negative for confusion and dysphoric mood.       Objective:   Physical Exam  Constitutional: He is oriented to person, place, and time. He appears well-developed and well-nourished. No distress.  HENT:  Head: Normocephalic and atraumatic.  Right Ear: External ear normal.  Left Ear: External ear normal.  Mouth/Throat: Oropharynx is clear and moist.  Eyes: Conjunctivae and EOM are normal. Pupils are equal, round, and reactive to light.  Neck: Normal range of motion. Neck supple. No thyromegaly present.  Cardiovascular: Normal rate, regular rhythm and normal heart sounds.   No murmur heard. Pulmonary/Chest: No respiratory distress. He has no wheezes. He has no rales.  Abdominal: Soft. Bowel sounds are normal. He exhibits no distension and no mass. There is no tenderness. There is no rebound and no guarding.  Genitourinary:  Prostate is slightly enlarged diffusely but no nodules and no rectal mass  Musculoskeletal: He exhibits no edema.  Lymphadenopathy:    He has no cervical adenopathy.  Neurological: He is alert and oriented to person, place, and time. He displays normal reflexes. No cranial nerve deficit.  Skin: No rash noted.  He has a few skin lesions left inguinal region which are minimally raised verrucous slightly scaly with clear well-demarcated border  Psychiatric: He has a normal mood and affect.          Assessment & Plan:  Complete physical. Labs reviewed. Mildly elevated PSA up approximate 1 point from last year. Repeat PSA in 4 months. Consider urology referral then if still climbing. We recommended consistent exercise and weight control. He has prediabetes which is unchanged from last year. Check  on coverage for shingles vaccine.  Probable verrucous keratoses left groin. Reassurance. Consider liquid nitrogen if these become bothersome

## 2014-04-13 ENCOUNTER — Encounter: Payer: Self-pay | Admitting: Family Medicine

## 2014-04-17 ENCOUNTER — Telehealth: Payer: Self-pay | Admitting: Neurology

## 2014-04-17 DIAGNOSIS — G40909 Epilepsy, unspecified, not intractable, without status epilepticus: Secondary | ICD-10-CM

## 2014-04-17 MED ORDER — DILANTIN 100 MG PO CAPS: ORAL_CAPSULE | ORAL | Status: DC

## 2014-04-17 NOTE — Telephone Encounter (Signed)
Patient is calling to get a refill for Dilantan 100 mg,  Please call.

## 2014-04-17 NOTE — Telephone Encounter (Signed)
Rx has been sent.  I called back.  He is aware.

## 2014-04-24 ENCOUNTER — Telehealth: Payer: Self-pay | Admitting: *Deleted

## 2014-04-24 NOTE — Telephone Encounter (Signed)
Form,DMV sent to [facility] and Dr Brett Fairy 04-24-14.

## 2014-04-26 DIAGNOSIS — Z0289 Encounter for other administrative examinations: Secondary | ICD-10-CM

## 2014-05-02 NOTE — Telephone Encounter (Signed)
Form completed, sent to Dr. Brett Fairy to sign.

## 2014-05-03 NOTE — Telephone Encounter (Signed)
Form,DMV received,completed by Dr Brett Fairy and Lovey Newcomer faxed 05-03-14.

## 2014-07-31 ENCOUNTER — Other Ambulatory Visit: Payer: Commercial Managed Care - PPO

## 2014-07-31 ENCOUNTER — Other Ambulatory Visit (INDEPENDENT_AMBULATORY_CARE_PROVIDER_SITE_OTHER): Payer: Commercial Managed Care - PPO

## 2014-07-31 DIAGNOSIS — R972 Elevated prostate specific antigen [PSA]: Secondary | ICD-10-CM

## 2014-07-31 LAB — PSA: PSA: 5.25 ng/mL — ABNORMAL HIGH (ref 0.10–4.00)

## 2014-08-01 ENCOUNTER — Other Ambulatory Visit: Payer: Self-pay | Admitting: Family Medicine

## 2014-08-01 DIAGNOSIS — R972 Elevated prostate specific antigen [PSA]: Secondary | ICD-10-CM

## 2014-10-17 ENCOUNTER — Other Ambulatory Visit: Payer: Self-pay | Admitting: Family Medicine

## 2014-10-18 ENCOUNTER — Other Ambulatory Visit: Payer: Self-pay

## 2014-10-18 DIAGNOSIS — F5105 Insomnia due to other mental disorder: Secondary | ICD-10-CM

## 2014-10-18 DIAGNOSIS — R569 Unspecified convulsions: Secondary | ICD-10-CM

## 2014-10-18 DIAGNOSIS — G4700 Insomnia, unspecified: Principal | ICD-10-CM

## 2014-10-18 MED ORDER — ZOLPIDEM TARTRATE 10 MG PO TABS: 10.0000 mg | ORAL_TABLET | Freq: Every evening | ORAL | Status: DC | PRN

## 2014-10-18 NOTE — Telephone Encounter (Signed)
Rx signed and faxed.

## 2014-12-06 ENCOUNTER — Ambulatory Visit (INDEPENDENT_AMBULATORY_CARE_PROVIDER_SITE_OTHER): Payer: Commercial Managed Care - PPO | Admitting: Family Medicine

## 2014-12-06 VITALS — BP 150/82 | HR 77 | Temp 98.5°F

## 2014-12-06 DIAGNOSIS — J209 Acute bronchitis, unspecified: Secondary | ICD-10-CM

## 2014-12-06 NOTE — Progress Notes (Signed)
   Subjective:    Patient ID: Curtis Sheppard, male    DOB: 12-02-1955, 59 y.o.   MRN: [id number]  HPI Patient is nonsmoker seen as a work in with 4 day history of cough. Mostly nonproductive. No fever. He's had some nasal congestion. No obvious wheezing. Taken Mucinex and DayQuil with some relief. He is aware of some occasional GERD symptoms which makes the cough is exacerbating. He has had prostate biopsy little over week ago and this came back positive for prostate cancer. He is exploring treatment options at this point. No dysuria  Past Medical History  Diagnosis Date  . Hyperlipidemia   . Seizures   . UTI (urinary tract infection)   . Epileptic seizure 02/21/2013    Single event.    Past Surgical History  Procedure Laterality Date  . Nasal septum surgery  1975    deviated    reports that he has never smoked. He does not have any smokeless tobacco history on file. He reports that he drinks alcohol. He reports that he does not use illicit drugs. family history includes Bipolar disorder in his brother; Cancer in his father and mother; Dementia in his maternal grandmother; Heart disease (age of onset: 60) in his brother; Stroke in his paternal grandfather. Allergies  Allergen Reactions  . Penicillins Other (See Comments)    Unknown   \    Review of Systems  Constitutional: Negative for fever, chills and appetite change.  HENT: Positive for congestion. Negative for sore throat.   Respiratory: Positive for cough. Negative for wheezing.   Cardiovascular: Negative for chest pain.       Objective:   Physical Exam  Constitutional: He appears well-developed and well-nourished.  HENT:  Right Ear: External ear normal.  Left Ear: External ear normal.  Mouth/Throat: Oropharynx is clear and moist.  Neck: Neck supple.  Cardiovascular: Normal rate and regular rhythm.   Pulmonary/Chest: Effort normal and breath sounds normal. No respiratory distress. He has no wheezes. He has no  rales.  Lymphadenopathy:    He has no cervical adenopathy.  Neurological: He is alert.          Assessment & Plan:  Acute bronchitis. Suspect viral. Nonfocal exam. Treat symptomatically. Continue over-the-counter Mucinex. Follow-up promptly for any fever or worsening symptoms.

## 2014-12-06 NOTE — Patient Instructions (Signed)

## 2014-12-06 NOTE — Progress Notes (Signed)
Pre visit review using our clinic review tool, if applicable. No additional management support is needed unless otherwise documented below in the visit note. 

## 2015-03-07 ENCOUNTER — Ambulatory Visit (INDEPENDENT_AMBULATORY_CARE_PROVIDER_SITE_OTHER): Payer: Commercial Managed Care - PPO | Admitting: Family Medicine

## 2015-03-07 VITALS — BP 110/80 | HR 84 | Temp 97.5°F | Ht 69.5 in | Wt 201.5 lb

## 2015-03-07 DIAGNOSIS — L309 Dermatitis, unspecified: Secondary | ICD-10-CM | POA: Diagnosis not present

## 2015-03-07 MED ORDER — TRIAMCINOLONE ACETONIDE 0.1 % EX CREA: 1.0000 "application " | TOPICAL_CREAM | Freq: Two times a day (BID) | CUTANEOUS | Status: DC | PRN

## 2015-03-07 NOTE — Progress Notes (Signed)
Pre visit review using our clinic review tool, if applicable. No additional management support is needed unless otherwise documented below in the visit note. 

## 2015-03-07 NOTE — Progress Notes (Signed)
   Subjective:    Patient ID: Curtis Sheppard, male    DOB: March 19, 1955, 60 y.o.   MRN: [id number]  HPI Patient seen with pruritic skin rash. Recent treatment for prostate cancer [facility]. He was prescribed Flomax and substitute until rash. He did not have any hives. His rash is distributed arms and lower legs. Using Neutrogena soap. Denies any trunk rash. Tried several things including Benadryl, Claritin, and Zyrtec which of helped relieve his itching but it did not help with the rash. No history of similar rash.  No known food allergies.  Past Medical History  Diagnosis Date  . Hyperlipidemia   . Seizures   . UTI (urinary tract infection)   . Epileptic seizure 02/21/2013    Single event.    Past Surgical History  Procedure Laterality Date  . Nasal septum surgery  1975    deviated    reports that he has never smoked. He does not have any smokeless tobacco history on file. He reports that he drinks alcohol. He reports that he does not use illicit drugs. family history includes Bipolar disorder in his brother; Cancer in his father and mother; Dementia in his maternal grandmother; Heart disease (age of onset: 50) in his brother; Stroke in his paternal grandfather. Allergies  Allergen Reactions  . Penicillins Other (See Comments)    Unknown       Review of Systems  Constitutional: Negative for fever and chills.  Skin: Positive for rash.       Objective:   Physical Exam  Constitutional: He appears well-developed and well-nourished.  Cardiovascular: Normal rate and regular rhythm.   Pulmonary/Chest: Effort normal and breath sounds normal. No respiratory distress. He has no wheezes. He has no rales.  Skin: Rash noted.  Somewhat nonspecific excoriated dry slightly erythematous rash with scattered patches on his lower legs and dorsal aspect hands and forearms. No hives. No pustules. No vesicles.          Assessment & Plan:  Nonspecific eczematous rash as above. Avoid  scratching. Triamcinolone 0.1% cream compounded 1:1 with Eucerin and use twice daily as needed. Avoid prolonged bathing

## 2015-03-07 NOTE — Patient Instructions (Signed)
Avoid prolonged contact with hot water Use prescribed cream twice daily May continue with over the counter anti-histamine as needed for itching.

## 2015-03-25 ENCOUNTER — Other Ambulatory Visit (INDEPENDENT_AMBULATORY_CARE_PROVIDER_SITE_OTHER): Payer: Commercial Managed Care - PPO

## 2015-03-25 DIAGNOSIS — R7989 Other specified abnormal findings of blood chemistry: Secondary | ICD-10-CM | POA: Diagnosis not present

## 2015-03-25 DIAGNOSIS — Z0000 Encounter for general adult medical examination without abnormal findings: Secondary | ICD-10-CM | POA: Diagnosis not present

## 2015-03-25 LAB — CBC WITH DIFFERENTIAL/PLATELET
Basophils Absolute: 0 10*3/uL (ref 0.0–0.1)
Basophils Relative: 0.7 % (ref 0.0–3.0)
Eosinophils Absolute: 0.2 10*3/uL (ref 0.0–0.7)
Eosinophils Relative: 3.8 % (ref 0.0–5.0)
HCT: 42.5 % (ref 39.0–52.0)
Hemoglobin: 14.7 g/dL (ref 13.0–17.0)
Lymphocytes Relative: 28.2 % (ref 12.0–46.0)
Lymphs Abs: 1.2 10*3/uL (ref 0.7–4.0)
MCHC: 34.5 g/dL (ref 30.0–36.0)
MCV: 93.6 fl (ref 78.0–100.0)
Monocytes Absolute: 0.3 10*3/uL (ref 0.1–1.0)
Monocytes Relative: 8.2 % (ref 3.0–12.0)
Neutro Abs: 2.4 10*3/uL (ref 1.4–7.7)
Neutrophils Relative %: 59.1 % (ref 43.0–77.0)
Platelets: 201 10*3/uL (ref 150.0–400.0)
RBC: 4.54 Mil/uL (ref 4.22–5.81)
RDW: 13.9 % (ref 11.5–15.5)
WBC: 4.1 10*3/uL (ref 4.0–10.5)

## 2015-03-25 LAB — LDL CHOLESTEROL, DIRECT: Direct LDL: 99 mg/dL

## 2015-03-25 LAB — BASIC METABOLIC PANEL
BUN: 17 mg/dL (ref 6–23)
CO2: 25 mEq/L (ref 19–32)
Calcium: 9.3 mg/dL (ref 8.4–10.5)
Chloride: 104 mEq/L (ref 96–112)
Creatinine, Ser: 1.07 mg/dL (ref 0.40–1.50)
GFR: 74.6 mL/min (ref >60.00)
Glucose, Bld: 102 mg/dL — ABNORMAL HIGH (ref 70–99)
Potassium: 4.7 mEq/L (ref 3.5–5.1)
Sodium: 139 mEq/L (ref 135–145)

## 2015-03-25 LAB — HEPATIC FUNCTION PANEL
ALT: 24 U/L (ref 0–53)
AST: 20 U/L (ref 0–37)
Albumin: 4.5 g/dL (ref 3.5–5.2)
Alkaline Phosphatase: 63 U/L (ref 39–117)
Bilirubin, Direct: 0.1 mg/dL (ref 0.0–0.3)
Total Bilirubin: 0.5 mg/dL (ref 0.2–1.2)
Total Protein: 6.9 g/dL (ref 6.0–8.3)

## 2015-03-25 LAB — LIPID PANEL
Cholesterol: 182 mg/dL (ref 0–200)
HDL: 52.7 mg/dL (ref >39.00)
NonHDL: 128.81
Total CHOL/HDL Ratio: 3
Triglycerides: 242 mg/dL — ABNORMAL HIGH (ref 0.0–149.0)
VLDL: 48.4 mg/dL — ABNORMAL HIGH (ref 0.0–40.0)

## 2015-03-25 LAB — PSA: PSA: 2.84 ng/mL (ref 0.10–4.00)

## 2015-03-25 LAB — TSH: TSH: 1.65 u[IU]/mL (ref 0.35–4.50)

## 2015-04-01 ENCOUNTER — Ambulatory Visit (INDEPENDENT_AMBULATORY_CARE_PROVIDER_SITE_OTHER): Payer: Commercial Managed Care - PPO | Admitting: Family Medicine

## 2015-04-01 ENCOUNTER — Encounter: Payer: Self-pay | Admitting: Family Medicine

## 2015-04-01 VITALS — BP 120/76 | HR 96 | Temp 97.6°F | Ht 69.5 in | Wt 201.2 lb

## 2015-04-01 DIAGNOSIS — Z0000 Encounter for general adult medical examination without abnormal findings: Secondary | ICD-10-CM

## 2015-04-01 DIAGNOSIS — Z23 Encounter for immunization: Secondary | ICD-10-CM | POA: Diagnosis not present

## 2015-04-01 NOTE — Progress Notes (Signed)
Subjective:    Patient ID: Curtis Sheppard, male    DOB: [birth year], 60 y.o.   MRN: [id number]  HPI   Patient here for complete physical. Has diagnosis of prostate cancer last year and is followed at [facility]. He had radiation therapy recently and they plan to do follow-up PSA and a couple of months. No history of shingles vaccine. Still needs flu vaccine. Other immunizations up-to-date. Colonoscopy up-to-date.   His mother passed away this past year and he was very involved in her care does not get much exercise and has gained about 15-20 pounds. He just recently started back walking one hour per day and plans to lose weight. He has gout but has only had about one flare per year and just takes Colchicine as needed.  Past Medical History  Diagnosis Date  . Hyperlipidemia   . Seizures ([place])   . UTI (urinary tract infection)   . Epileptic seizure ([place]) 02/21/2013    Single event.    Past Surgical History  Procedure Laterality Date  . Nasal septum surgery  1975    deviated    reports that he has never smoked. He does not have any smokeless tobacco history on file. He reports that he drinks alcohol. He reports that he does not use illicit drugs. family history includes Bipolar disorder in his brother; Cancer in his father and mother; Dementia in his maternal grandmother; Heart disease (age of onset: 66) in his brother; Stroke in his paternal grandfather. Allergies  Allergen Reactions  . Penicillins Other (See Comments)    Unknown       Review of Systems  Constitutional: Negative for fever, activity change, appetite change and fatigue.  HENT: Negative for congestion, ear pain and trouble swallowing.   Eyes: Negative for pain and visual disturbance.  Respiratory: Negative for cough, shortness of breath and wheezing.   Cardiovascular: Negative for chest pain and palpitations.  Gastrointestinal: Negative for nausea, vomiting, abdominal pain, diarrhea, constipation, blood in  stool, abdominal distention and rectal pain.  Genitourinary: Negative for dysuria, hematuria and testicular pain.  Musculoskeletal: Negative for joint swelling and arthralgias.  Skin: Negative for rash.  Neurological: Negative for dizziness, syncope and headaches.  Hematological: Negative for adenopathy.  Psychiatric/Behavioral: Negative for confusion and dysphoric mood.       Objective:   Physical Exam  Constitutional: He is oriented to person, place, and time. He appears well-developed and well-nourished. No distress.  HENT:  Head: Normocephalic and atraumatic.  Right Ear: External ear normal.  Left Ear: External ear normal.  Mouth/Throat: Oropharynx is clear and moist.  Eyes: Conjunctivae and EOM are normal. Pupils are equal, round, and reactive to light.  Neck: Normal range of motion. Neck supple. No thyromegaly present.  Cardiovascular: Normal rate, regular rhythm and normal heart sounds.   No murmur heard. Pulmonary/Chest: No respiratory distress. He has no wheezes. He has no rales.  Abdominal: Soft. Bowel sounds are normal. He exhibits no distension and no mass. There is no tenderness. There is no rebound and no guarding.  Musculoskeletal: He exhibits no edema.  Lymphadenopathy:    He has no cervical adenopathy.  Neurological: He is alert and oriented to person, place, and time. He displays normal reflexes. No cranial nerve deficit.  Skin: No rash noted.  Psychiatric: He has a normal mood and affect.          Assessment & Plan:  Physical exam. Labs reviewed. No major concerns. His triglycerides are up probably related  to lack of exercise over the past several months and poor eating habits. Check on coverage for shingles vaccine. Handout given on hypertriglyceridemia. He'll continue follow-up at [facility] regarding his prostate cancer.

## 2015-04-01 NOTE — Patient Instructions (Signed)

## 2015-04-01 NOTE — Progress Notes (Signed)
Pre visit review using our clinic review tool, if applicable. No additional management support is needed unless otherwise documented below in the visit note. 

## 2015-06-10 ENCOUNTER — Ambulatory Visit (INDEPENDENT_AMBULATORY_CARE_PROVIDER_SITE_OTHER): Payer: Commercial Managed Care - PPO | Admitting: Family Medicine

## 2015-06-10 ENCOUNTER — Encounter: Payer: Self-pay | Admitting: Family Medicine

## 2015-06-10 VITALS — BP 120/88 | HR 98 | Temp 98.3°F | Ht 69.5 in | Wt 201.0 lb

## 2015-06-10 DIAGNOSIS — H9202 Otalgia, left ear: Secondary | ICD-10-CM

## 2015-06-10 DIAGNOSIS — Z23 Encounter for immunization: Secondary | ICD-10-CM | POA: Diagnosis not present

## 2015-06-10 NOTE — Progress Notes (Signed)
   Subjective:    Patient ID: Curtis Sheppard, male    DOB: 01/25/1956, 60 y.o.   MRN: [id number]  HPI  Patient seen with left otalgia for the past couple weeks.  Denies any drainage. No recent swimming. No nasal congestion.  No sinusitis symptoms. No recent change of hearing.  Patient is concerned because he flies tomorrow morning.   Patient also requesting shingles vaccine. No contraindications.  Past Medical History  Diagnosis Date  . Hyperlipidemia   . Seizures ([place])   . UTI (urinary tract infection)   . Epileptic seizure ([place]) 02/21/2013    Single event.    Past Surgical History  Procedure Laterality Date  . Nasal septum surgery  1975    deviated    reports that he has never smoked. He does not have any smokeless tobacco history on file. He reports that he drinks alcohol. He reports that he does not use illicit drugs. family history includes Bipolar disorder in his brother; Cancer in his father and mother; Dementia in his maternal grandmother; Heart disease (age of onset: 63) in his brother; Stroke in his paternal grandfather. Allergies  Allergen Reactions  . Penicillins Other (See Comments)    Unknown       Review of Systems  Constitutional: Negative for fever and chills.  HENT: Positive for ear pain. Negative for congestion and hearing loss.   Respiratory: Negative for cough.   Neurological: Negative for headaches.       Objective:   Physical Exam  Constitutional: He appears well-developed and well-nourished.  HENT:  Right Ear: External ear normal.  Eardrum appears normal. He has very minimal swelling and perhaps slight increased redness anterior canal. No obvious furuncle. No evidence for otitis externa.  Cardiovascular: Normal rate and regular rhythm.   Pulmonary/Chest: Effort normal and breath sounds normal. No respiratory distress. He has no wheezes. He has no rales.          Assessment & Plan:   Left otalgia. No evidence for otitis media.  Possible very early furuncle left anterior canal but not obvious. Warm compresses and observe. Follow-up as needed   Prevention- patient requesting shingles vaccine. No contraindication.

## 2015-06-10 NOTE — Progress Notes (Signed)
Pre visit review using our clinic review tool, if applicable. No additional management support is needed unless otherwise documented below in the visit note. 

## 2015-07-11 ENCOUNTER — Other Ambulatory Visit: Payer: Self-pay | Admitting: Family Medicine

## 2016-03-05 DIAGNOSIS — C61 Malignant neoplasm of prostate: Secondary | ICD-10-CM | POA: Diagnosis not present

## 2016-04-01 ENCOUNTER — Other Ambulatory Visit (INDEPENDENT_AMBULATORY_CARE_PROVIDER_SITE_OTHER): Payer: Commercial Managed Care - PPO

## 2016-04-01 DIAGNOSIS — Z0000 Encounter for general adult medical examination without abnormal findings: Secondary | ICD-10-CM

## 2016-04-01 LAB — HEPATIC FUNCTION PANEL
ALT: 25 U/L (ref 0–53)
AST: 21 U/L (ref 0–37)
Albumin: 4.3 g/dL (ref 3.5–5.2)
Alkaline Phosphatase: 45 U/L (ref 39–117)
Bilirubin, Direct: 0.1 mg/dL (ref 0.0–0.3)
Total Bilirubin: 0.7 mg/dL (ref 0.2–1.2)
Total Protein: 6.7 g/dL (ref 6.0–8.3)

## 2016-04-01 LAB — LIPID PANEL
Cholesterol: 162 mg/dL (ref 0–200)
HDL: 50.8 mg/dL (ref >39.00)
LDL Cholesterol: 74 mg/dL (ref 0–99)
NonHDL: 110.79
Total CHOL/HDL Ratio: 3
Triglycerides: 183 mg/dL — ABNORMAL HIGH (ref 0.0–149.0)
VLDL: 36.6 mg/dL (ref 0.0–40.0)

## 2016-04-01 LAB — CBC WITH DIFFERENTIAL/PLATELET
Basophils Absolute: 0 10*3/uL (ref 0.0–0.1)
Basophils Relative: 0.7 % (ref 0.0–3.0)
Eosinophils Absolute: 0.1 10*3/uL (ref 0.0–0.7)
Eosinophils Relative: 3.1 % (ref 0.0–5.0)
HCT: 42 % (ref 39.0–52.0)
Hemoglobin: 14.7 g/dL (ref 13.0–17.0)
Lymphocytes Relative: 27.7 % (ref 12.0–46.0)
Lymphs Abs: 1.3 10*3/uL (ref 0.7–4.0)
MCHC: 35 g/dL (ref 30.0–36.0)
MCV: 92.4 fl (ref 78.0–100.0)
Monocytes Absolute: 0.4 10*3/uL (ref 0.1–1.0)
Monocytes Relative: 8.4 % (ref 3.0–12.0)
Neutro Abs: 2.8 10*3/uL (ref 1.4–7.7)
Neutrophils Relative %: 60.1 % (ref 43.0–77.0)
Platelets: 186 10*3/uL (ref 150.0–400.0)
RBC: 4.55 Mil/uL (ref 4.22–5.81)
RDW: 13.3 % (ref 11.5–15.5)
WBC: 4.6 10*3/uL (ref 4.0–10.5)

## 2016-04-01 LAB — BASIC METABOLIC PANEL
BUN: 12 mg/dL (ref 6–23)
CO2: 29 mEq/L (ref 19–32)
Calcium: 9.2 mg/dL (ref 8.4–10.5)
Chloride: 107 mEq/L (ref 96–112)
Creatinine, Ser: 1.11 mg/dL (ref 0.40–1.50)
GFR: 71.27 mL/min (ref >60.00)
Glucose, Bld: 124 mg/dL — ABNORMAL HIGH (ref 70–99)
Potassium: 4.7 mEq/L (ref 3.5–5.1)
Sodium: 141 mEq/L (ref 135–145)

## 2016-04-01 LAB — PSA: PSA: 2.42 ng/mL (ref 0.10–4.00)

## 2016-04-01 LAB — TSH: TSH: 1.65 u[IU]/mL (ref 0.35–4.50)

## 2016-04-06 ENCOUNTER — Encounter: Payer: Self-pay | Admitting: Family Medicine

## 2016-04-06 ENCOUNTER — Ambulatory Visit (INDEPENDENT_AMBULATORY_CARE_PROVIDER_SITE_OTHER): Payer: Commercial Managed Care - PPO | Admitting: Family Medicine

## 2016-04-06 VITALS — BP 120/80 | HR 84 | Ht 69.5 in | Wt 202.0 lb

## 2016-04-06 DIAGNOSIS — R7303 Prediabetes: Secondary | ICD-10-CM | POA: Diagnosis not present

## 2016-04-06 DIAGNOSIS — Z0000 Encounter for general adult medical examination without abnormal findings: Secondary | ICD-10-CM | POA: Diagnosis not present

## 2016-04-06 LAB — HEMOGLOBIN A1C: Hgb A1c MFr Bld: 5.4 % (ref 4.6–6.5)

## 2016-04-06 NOTE — Patient Instructions (Signed)
Set up repeat colonoscopy this year. Let me know if interested in treating skin tags on face.

## 2016-04-06 NOTE — Progress Notes (Signed)
Subjective:     Patient ID: Curtis Sheppard, male   DOB: Feb 23, 1956, 61 y.o.   MRN: [id number]  HPI Patient's in for physical exam. He is due for repeat colonoscopy this year. He has prostate cancer and has received radiation therapy at [facility] and is followed there regularly. Has no specific risk factors for hep C other than age but has never been tested. He has history of prediabetes in 2 brothers. He has hyperlipidemia treated simvastatin. He has chronic insomnia and takes melatonin for that. He started exercising currently on treadmill 6 days per week. No chest pains. No dyspnea. No dizziness.  Past Medical History:  Diagnosis Date  . Epileptic seizure ([place]) 02/21/2013   Single event.   . Hyperlipidemia   . Seizures ([place])   . UTI (urinary tract infection)    Past Surgical History:  Procedure Laterality Date  . NASAL SEPTUM SURGERY  1975   deviated    reports that he has never smoked. He has never used smokeless tobacco. He reports that he drinks alcohol. He reports that he does not use drugs. family history includes Bipolar disorder in his brother; Cancer in his father and mother; Dementia in his maternal grandmother; Heart disease (age of onset: 44) in his brother; Stroke in his paternal grandfather. Allergies  Allergen Reactions  . Penicillins Other (See Comments)    Unknown      Review of Systems  Constitutional: Negative for activity change, appetite change, fatigue and fever.  HENT: Negative for congestion, ear pain and trouble swallowing.   Eyes: Negative for pain and visual disturbance.  Respiratory: Negative for cough, shortness of breath and wheezing.   Cardiovascular: Negative for chest pain and palpitations.  Gastrointestinal: Negative for abdominal distention, abdominal pain, blood in stool, constipation, diarrhea, nausea, rectal pain and vomiting.  Genitourinary: Negative for dysuria, hematuria and testicular pain.  Musculoskeletal: Negative for  arthralgias and joint swelling.  Skin: Negative for rash.  Neurological: Negative for dizziness, syncope and headaches.  Hematological: Negative for adenopathy.  Psychiatric/Behavioral: Negative for confusion and dysphoric mood.       Objective:   Physical Exam  Constitutional: He is oriented to person, place, and time. He appears well-developed and well-nourished. No distress.  HENT:  Head: Normocephalic and atraumatic.  Right Ear: External ear normal.  Left Ear: External ear normal.  Mouth/Throat: Oropharynx is clear and moist.  Eyes: Conjunctivae and EOM are normal. Pupils are equal, round, and reactive to light.  Neck: Normal range of motion. Neck supple. No thyromegaly present.  Cardiovascular: Normal rate, regular rhythm and normal heart sounds.   No murmur heard. Pulmonary/Chest: No respiratory distress. He has no wheezes. He has no rales.  Abdominal: Soft. Bowel sounds are normal. He exhibits no distension and no mass. There is no tenderness. There is no rebound and no guarding.  Musculoskeletal: He exhibits no edema.  Lymphadenopathy:    He has no cervical adenopathy.  Neurological: He is alert and oriented to person, place, and time. He displays normal reflexes. No cranial nerve deficit.  Skin: No rash noted.  Psychiatric: He has a normal mood and affect.       Assessment:     Physical exam. Generally healthy 61 year old male. He has hyperlipidemia which is well controlled. Labs reviewed. He has prediabetes with fasting glucose 124    Plan:     -Encouraged weight loss -Patient's encouraged to set up repeat colonoscopy -Check hepatitis C and hemoglobin A1c baseline -continue regular  exercise habits.  Eulas Post MD [facility] at [facility]

## 2016-04-06 NOTE — Progress Notes (Signed)
Pre visit review using our clinic review tool, if applicable. No additional management support is needed unless otherwise documented below in the visit note. 

## 2016-04-07 ENCOUNTER — Encounter: Payer: Self-pay | Admitting: Family Medicine

## 2016-04-07 LAB — HEPATITIS C ANTIBODY: HCV Ab: NEGATIVE

## 2016-05-15 ENCOUNTER — Telehealth: Payer: Self-pay | Admitting: Family Medicine

## 2016-05-15 NOTE — Telephone Encounter (Signed)
I will be happy to.  Route back to me after notifying him.

## 2016-05-15 NOTE — Telephone Encounter (Signed)
Curtis Sheppard spoke with patient and he is aware of the letter.

## 2016-05-15 NOTE — Telephone Encounter (Signed)
Pt would like to see if Dr. Elease Hashimoto would write him a letter to the insurance company stating that it was medically necessary to go to the [facility] to have a biopsy done and he is also going to ask [facility] to do the same with the diagnosis that he can include it into his appeal to the insurance company.  Pt would like to have a call to let him know if Dr. Elease Hashimoto will do this for him.

## 2016-05-15 NOTE — Telephone Encounter (Signed)
Left message on machine for patient to return our call 

## 2016-05-17 ENCOUNTER — Encounter: Payer: Self-pay | Admitting: Family Medicine

## 2016-05-17 DIAGNOSIS — C61 Malignant neoplasm of prostate: Secondary | ICD-10-CM | POA: Insufficient documentation

## 2016-05-17 NOTE — Telephone Encounter (Signed)
Letter done

## 2016-05-18 DIAGNOSIS — Z1211 Encounter for screening for malignant neoplasm of colon: Secondary | ICD-10-CM | POA: Diagnosis not present

## 2016-05-18 NOTE — Telephone Encounter (Signed)
Letter ready for pick up and patient is aware. 

## 2016-05-20 ENCOUNTER — Telehealth: Payer: Self-pay | Admitting: Family Medicine

## 2016-05-20 ENCOUNTER — Other Ambulatory Visit: Payer: Self-pay | Admitting: *Deleted

## 2016-05-20 MED ORDER — TRIAMCINOLONE ACETONIDE 0.1 % EX CREA: 1.0000 "application " | TOPICAL_CREAM | Freq: Two times a day (BID) | CUTANEOUS | 1 refills | Status: DC | PRN

## 2016-05-20 NOTE — Telephone Encounter (Signed)
The patient needs Rx refilled:  triamcinolone cream (KENALOG) 0.1 %  Sent to: [id number] - [place], [place] - [phone] (Phone) [phone] (Fax)

## 2016-06-04 DIAGNOSIS — Z1211 Encounter for screening for malignant neoplasm of colon: Secondary | ICD-10-CM | POA: Diagnosis not present

## 2016-06-04 DIAGNOSIS — K5730 Diverticulosis of large intestine without perforation or abscess without bleeding: Secondary | ICD-10-CM | POA: Diagnosis not present

## 2016-06-04 DIAGNOSIS — K635 Polyp of colon: Secondary | ICD-10-CM | POA: Diagnosis not present

## 2016-06-04 DIAGNOSIS — D120 Benign neoplasm of cecum: Secondary | ICD-10-CM | POA: Diagnosis not present

## 2016-09-03 DIAGNOSIS — C61 Malignant neoplasm of prostate: Secondary | ICD-10-CM | POA: Diagnosis not present

## 2016-11-19 ENCOUNTER — Encounter: Payer: Self-pay | Admitting: Family Medicine

## 2016-12-21 ENCOUNTER — Encounter: Payer: Self-pay | Admitting: Family Medicine

## 2016-12-21 ENCOUNTER — Ambulatory Visit (INDEPENDENT_AMBULATORY_CARE_PROVIDER_SITE_OTHER): Payer: Commercial Managed Care - PPO | Admitting: Family Medicine

## 2016-12-21 VITALS — BP 148/80 | HR 65 | Temp 97.9°F | Wt 200.0 lb

## 2016-12-21 DIAGNOSIS — L918 Other hypertrophic disorders of the skin: Secondary | ICD-10-CM | POA: Diagnosis not present

## 2016-12-21 DIAGNOSIS — J019 Acute sinusitis, unspecified: Secondary | ICD-10-CM

## 2016-12-21 MED ORDER — TAMSULOSIN HCL 0.4 MG PO CAPS: 0.4000 mg | ORAL_CAPSULE | Freq: Every day | ORAL | 11 refills | Status: DC

## 2016-12-21 MED ORDER — CEFUROXIME AXETIL 500 MG PO TABS: 500.0000 mg | ORAL_TABLET | Freq: Two times a day (BID) | ORAL | 0 refills | Status: DC

## 2016-12-21 NOTE — Patient Instructions (Signed)
Keep wound dry for the first 24 hours then clean daily with soap and water for one week. Apply topical vaseline daily for 3-4 days. Keep covered with clean dressing for 4-5 days. Follow up promptly for any signs of infection such as redness, warmth, pain, or drainage.   

## 2016-12-21 NOTE — Progress Notes (Signed)
Subjective:     Patient ID: Curtis Sheppard, male   DOB: 1955/08/25, 61 y.o.   MRN: [id number]  HPI Patient is here for the following issues  Almost 3 week history of sinus congestion and bilateral facial pain with headaches and malaise. He's tried over-the-counter decongestants without much improvement. Symptoms wax and wane. He's had some productive cough with yellow sputum. If anything, seems to be getting worse  Irritated acrochordon left lower lumbar region. Rubs against clothing. Occasionally bleeds and irritated. He's had this for many years and otherwise not causing any problems.  Pt requesting removal.  Past Medical History:  Diagnosis Date  . Epileptic seizure ([place]) 02/21/2013   Single event.   . Hyperlipidemia   . Seizures ([place])   . UTI (urinary tract infection)    Past Surgical History:  Procedure Laterality Date  . NASAL SEPTUM SURGERY  1975   deviated    reports that he has never smoked. He has never used smokeless tobacco. He reports that he drinks alcohol. He reports that he does not use drugs. family history includes Bipolar disorder in his brother; Cancer in his father and mother; Dementia in his maternal grandmother; Heart disease (age of onset: 35) in his brother; Stroke in his paternal grandfather. Allergies  Allergen Reactions  . Penicillins Other (See Comments)    Unknown      Review of Systems  Constitutional: Negative for chills and fever.  HENT: Positive for congestion, sinus pain and sinus pressure.   Neurological: Positive for headaches.       Objective:   Physical Exam  Constitutional: He appears well-developed and well-nourished.  HENT:  Right Ear: External ear normal.  Left Ear: External ear normal.  Mouth/Throat: Oropharynx is clear and moist.  Neck: Neck supple.  Cardiovascular: Normal rate and regular rhythm.   Pulmonary/Chest: Breath sounds normal. No respiratory distress. He has no wheezes. He has no rales.  Lymphadenopathy:    He  has no cervical adenopathy.  Skin:  Patient has benign-appearing acrochordon left lower lumbar region. Slightly irritated on the surface from recent abrasion.  8 to 9 mm at base.       Assessment:     #1 acute sinusitis  #2 irritated skin tag left lower back    Plan:     -Given duration of symptoms start Ceftin 500 mg twice a day for 10 days. -Continue good hydration and decongestant as needed -Discussed risks and benefits of shave excision including risks of bleeding, bruising, scar formation, and infection and patient consented to proceed.  Anesthesia with 1% Xylocaine with epinephrine. Skin prepped with betadine and alcohol and shave excision of lesion with #15 blade with minimal bleeding.  Patient tolerated well.  Antibiotic and dressing  applied.  Eulas Post MD [facility] at [facility]

## 2017-01-05 ENCOUNTER — Ambulatory Visit: Payer: Commercial Managed Care - PPO | Admitting: Family Medicine

## 2017-01-05 ENCOUNTER — Encounter: Payer: Self-pay | Admitting: Family Medicine

## 2017-01-05 VITALS — BP 110/60 | HR 96 | Temp 97.7°F | Ht 69.5 in | Wt 200.0 lb

## 2017-01-05 DIAGNOSIS — L918 Other hypertrophic disorders of the skin: Secondary | ICD-10-CM | POA: Diagnosis not present

## 2017-01-05 DIAGNOSIS — Z87898 Personal history of other specified conditions: Secondary | ICD-10-CM | POA: Diagnosis not present

## 2017-01-05 DIAGNOSIS — G4700 Insomnia, unspecified: Secondary | ICD-10-CM

## 2017-01-05 DIAGNOSIS — F5105 Insomnia due to other mental disorder: Secondary | ICD-10-CM

## 2017-01-05 DIAGNOSIS — R55 Syncope and collapse: Secondary | ICD-10-CM

## 2017-01-05 MED ORDER — ZOLPIDEM TARTRATE 10 MG PO TABS: 10.0000 mg | ORAL_TABLET | Freq: Every evening | ORAL | 0 refills | Status: DC | PRN

## 2017-01-05 NOTE — Progress Notes (Signed)
Subjective:     Patient ID: Curtis Sheppard, male   DOB: 02/12/1956, 61 y.o.   MRN: [id number]  HPI Patient scheduled today for skin tag excision left upper lid. This has been growing slowly over time and starting to become a nuisance and aggravation. He inquired about getting this excised recently.  He mentions though that he had a syncopal episode this morning around 9:30 AM. He does have history of reported grand mal seizure about 14 years ago and was treated with Dilantin for about 10 years. He reports that he got up this morning and exercised for about an hour (which is his usual routine) and felt fine during exercise. He took a shower and re-hydrated and then went outside around 9:30 AM to do some yardwork. He was not exerting himself at the time. He felt lightheaded and then apparently lost consciousness. There was no loss of urine or stool continence. Episode was unobserved. He estimates he was out about 15 minutes but does not know for sure. He states that he felt somewhat "groggy" afterwards for several minutes- possibly over 30 minutes.  He's had no dizziness or syncope since then. No recent chest pains. No dyspnea. Exercises fairly vigorously with good tolerance. No cardiac history. No history of vasovagal syncope. No recent palpitations. Generally makes good effort to stay well-hydrated  Past Medical History:  Diagnosis Date  . Epileptic seizure ([place]) 02/21/2013   Single event.   . Hyperlipidemia   . Seizures ([place])   . UTI (urinary tract infection)    Past Surgical History:  Procedure Laterality Date  . NASAL SEPTUM SURGERY  1975   deviated    reports that  has never smoked. he has never used smokeless tobacco. He reports that he drinks alcohol. He reports that he does not use drugs. family history includes Bipolar disorder in his brother; Cancer in his father and mother; Dementia in his maternal grandmother; Heart disease (age of onset: 74) in his brother; Stroke in his paternal  grandfather. Allergies  Allergen Reactions  . Penicillins Other (See Comments)    Unknown      Review of Systems  Constitutional: Negative for chills, fatigue and fever.  Respiratory: Negative for shortness of breath.   Cardiovascular: Negative for chest pain, palpitations and leg swelling.  Gastrointestinal: Negative for abdominal pain, blood in stool, constipation, diarrhea, nausea and vomiting.  Genitourinary: Negative for dysuria.  Musculoskeletal: Negative for back pain.  Neurological: Positive for syncope. Negative for tremors, speech difficulty, weakness and headaches.  Hematological: Negative for adenopathy. Does not bruise/bleed easily.  Psychiatric/Behavioral: Negative for confusion.       Objective:   Physical Exam  Constitutional: He is oriented to person, place, and time. He appears well-developed and well-nourished.  HENT:  Head: Normocephalic and atraumatic.  Mouth/Throat: Oropharynx is clear and moist.  Eyes: Pupils are equal, round, and reactive to light.  Neck: Neck supple. No thyromegaly present.  Cardiovascular: Normal rate and regular rhythm. Exam reveals no gallop.  No murmur heard. Pulmonary/Chest: Effort normal and breath sounds normal. No respiratory distress. He has no wheezes. He has no rales.  Musculoskeletal: He exhibits no edema.  Lymphadenopathy:    He has no cervical adenopathy.  Neurological: He is alert and oriented to person, place, and time. No cranial nerve deficit. Coordination normal.  No focal weakness. Cerebellar function normal. Gait normal.  Skin: No rash noted.  Patient has several facial skin tags and larger one left upper lid which has a very narrow  base but hangs down around the lid margin  Psychiatric: He has a normal mood and affect. His behavior is normal. Judgment and thought content normal.       Assessment:     #1 skin tag left upper eyelid  #2 syncopal episode as above. Remote history of seizure disorder but not for  several years. Based on history, doubt vasovagal syncope and doubt orthostatic. I am concerned whether he might have had recurrent seizure. Doubt cardiac    Plan:     -Check labs with comprehensive metabolic panel, CBC, magnesium level -Check EKG=NSR with no acute changes. -Will recommend neurology referral for further evaluation. Will likely need EEG and further workup.  Has seen [facility] in past. -If neurology feels not related to seizure consider event monitor. -Patient requesting removal of bothersome skin tag left upper lid. This is on a very narrow stalk and we discussed risks including bleeding and infection. Patient consented. We used some surgical scissors and excised this at the base.   Skin tag left upper lid with minimal bleeding which was controlled with pressure. Follow-up for any signs of secondary infection -We have cautioned him about driving until further evaluated for his syncopal episode -also advised no climbing on ladders, roof, etc until further evaluation  Eulas Post MD [facility] at [facility]

## 2017-01-05 NOTE — Patient Instructions (Signed)
Syncope °Syncope is when you temporarily lose consciousness. Syncope may also be called fainting or passing out. It is caused by a sudden decrease in blood flow to the brain. Even though most causes of syncope are not dangerous, syncope can be a sign of a serious medical problem. Signs that you may be about to faint include: °· Feeling dizzy or light-headed. °· Feeling nauseous. °· Seeing all white or all black in your field of vision. °· Having cold, clammy skin. °If you fainted, get medical help right away.Call your local emergency services (911 in the [place] Do not drive yourself to the hospital. °Follow these instructions at home: °Pay attention to any changes in your symptoms. Take these actions to help with your condition: °· Have someone stay with you until you feel stable. °· Do not drive, use machinery, or play sports until your health care provider says it is okay. °· Keep all follow-up visits as told by your health care provider. This is important. °· If you start to feel like you might faint, lie down right away and raise (elevate) your feet above the level of your heart. Breathe deeply and steadily. Wait until all of the symptoms have passed. °· Drink enough fluid to keep your urine clear or pale yellow. °· If you are taking blood pressure or heart medicine, get up slowly and take several minutes to sit and then stand. This can reduce dizziness. °· Take over-the-counter and prescription medicines only as told by your health care provider. °Get help right away if: °· You have a severe headache. °· You have unusual pain in your chest, abdomen, or back. °· You are bleeding from your mouth or rectum, or you have black or tarry stool. °· You have a very fast or irregular heartbeat (palpitations). °· You have pain with breathing. °· You faint once or repeatedly. °· You have a seizure. °· You are confused. °· You have trouble walking. °· You have severe weakness. °· You have vision problems. °These symptoms  may represent a serious problem that is an emergency. Do not wait to see if your symptoms will go away. Get medical help right away. Call your local emergency services (911 in the [place] Do not drive yourself to the hospital. °This information is not intended to replace advice given to you by your health care provider. Make sure you discuss any questions you have with your health care provider. °Document Released: 02/16/2005 Document Revised: 07/25/2015 Document Reviewed: 10/31/2014 °Elsevier Interactive Patient Education © 2017 [employer] ° °

## 2017-01-06 LAB — COMPREHENSIVE METABOLIC PANEL
ALT: 31 U/L (ref 0–53)
AST: 42 U/L — ABNORMAL HIGH (ref 0–37)
Albumin: 4.6 g/dL (ref 3.5–5.2)
Alkaline Phosphatase: 41 U/L (ref 39–117)
BUN: 14 mg/dL (ref 6–23)
CO2: 27 mEq/L (ref 19–32)
Calcium: 9.8 mg/dL (ref 8.4–10.5)
Chloride: 103 mEq/L (ref 96–112)
Creatinine, Ser: 1.09 mg/dL (ref 0.40–1.50)
GFR: 72.6 mL/min (ref >60.00)
Glucose, Bld: 109 mg/dL — ABNORMAL HIGH (ref 70–99)
Potassium: 4.2 mEq/L (ref 3.5–5.1)
Sodium: 140 mEq/L (ref 135–145)
Total Bilirubin: 0.6 mg/dL (ref 0.2–1.2)
Total Protein: 7.2 g/dL (ref 6.0–8.3)

## 2017-01-06 LAB — CBC WITH DIFFERENTIAL/PLATELET
Basophils Absolute: 0.1 10*3/uL (ref 0.0–0.1)
Basophils Relative: 0.8 % (ref 0.0–3.0)
Eosinophils Absolute: 0 10*3/uL (ref 0.0–0.7)
Eosinophils Relative: 0.1 % (ref 0.0–5.0)
HCT: 43.1 % (ref 39.0–52.0)
Hemoglobin: 14.9 g/dL (ref 13.0–17.0)
Lymphocytes Relative: 10.3 % — ABNORMAL LOW (ref 12.0–46.0)
Lymphs Abs: 1 10*3/uL (ref 0.7–4.0)
MCHC: 34.7 g/dL (ref 30.0–36.0)
MCV: 94.6 fl (ref 78.0–100.0)
Monocytes Absolute: 0.6 10*3/uL (ref 0.1–1.0)
Monocytes Relative: 6.7 % (ref 3.0–12.0)
Neutro Abs: 7.7 10*3/uL (ref 1.4–7.7)
Neutrophils Relative %: 82.1 % — ABNORMAL HIGH (ref 43.0–77.0)
Platelets: 211 10*3/uL (ref 150.0–400.0)
RBC: 4.56 Mil/uL (ref 4.22–5.81)
RDW: 13.5 % (ref 11.5–15.5)
WBC: 9.4 10*3/uL (ref 4.0–10.5)

## 2017-01-06 LAB — MAGNESIUM: Magnesium: 2.3 mg/dL (ref 1.5–2.5)

## 2017-02-24 ENCOUNTER — Encounter: Payer: Self-pay | Admitting: Neurology

## 2017-02-24 ENCOUNTER — Ambulatory Visit: Payer: Commercial Managed Care - PPO | Admitting: Neurology

## 2017-02-24 VITALS — BP 146/86 | HR 68 | Ht 70.0 in | Wt 201.0 lb

## 2017-02-24 DIAGNOSIS — R55 Syncope and collapse: Secondary | ICD-10-CM | POA: Diagnosis not present

## 2017-02-24 DIAGNOSIS — R7303 Prediabetes: Secondary | ICD-10-CM | POA: Diagnosis not present

## 2017-02-24 DIAGNOSIS — T50905A Adverse effect of unspecified drugs, medicaments and biological substances, initial encounter: Secondary | ICD-10-CM | POA: Insufficient documentation

## 2017-02-24 DIAGNOSIS — F513 Sleepwalking [somnambulism]: Secondary | ICD-10-CM

## 2017-02-24 DIAGNOSIS — Z8669 Personal history of other diseases of the nervous system and sense organs: Secondary | ICD-10-CM | POA: Diagnosis not present

## 2017-02-24 MED ORDER — ALPRAZOLAM 0.5 MG PO TABS: 0.5000 mg | ORAL_TABLET | Freq: Every evening | ORAL | 1 refills | Status: DC | PRN

## 2017-02-24 MED ORDER — TRIAMCINOLONE ACETONIDE 55 MCG/ACT NA AERO: 2.0000 | INHALATION_SPRAY | Freq: Every evening | NASAL | 12 refills | Status: AC | PRN

## 2017-02-24 NOTE — Progress Notes (Signed)
[facility]   Provider:  Larey Seat, [place] D  Primary Care Physician:  Eulas Post, MD   Referring Provider: Eulas Post, MD    Chief Complaint  Patient presents with  . New Patient (Initial Visit)    pt with wife, rm 5. pt stated roughly after taking an antibiotic for a head cold the patient had the syncopial event.. pt states this event was just the one time. he complains of fatigue but this has been present since the cold/ virus in october.    HPI:  Curtis Sheppard is a 61 y.o. male , seen here in a referral from Dr. Elease Hashimoto for evaluation of a fainting spell, a single event.   I had the pleasure of following with Mr. Domagalski before, as he had a history of a remote seizure disorder an epileptic event in 2000 and 2001.  His primary care physician was concerned that this could be a new neurologic event or that the syncope could be related.  He stated that based on history he doubts that our patient has vagal vagal syncope or orthostatic origin of syncope-therefore reinitiating of workup for seizure-rule out seizure.  An event monitor was discussed in case that our neurologic workup is negative. The day of the syncope was the of November of this year, occurred in the morning.  He felt fine when he got up in the morning he went exercising for about an hour which is his daily routine then took a shower drink fluids and went outside around 9:30 AM to work in the garden.  He felt suddenly lightheaded and apparently lost consciousness, he fell down but has no recollection of how and when.  There was no incontinence no tongue biting and there was nobody witnessing the event.  He estimated that he may be out for as long as 15 minutes but cannot be sure he felt somewhat groggy afterwards possibly over 30 minutes of the question now is if this was a postictal episode. He has not had a flu shot. He had a viral infection since October 4th , hacking cough, non productive. ,no   febrile episodes, but chills. He was  sleep deprived. ATB were prescribed and his head "cleared up ".  The patient states that he was sleep impaired or deprived after weeks of coughing, and he took a decongestant at the time that he had his seizure within in the same couple of days. And herecently had 2 sleep walking episodes- on [place]!   Chief complaint according to patient : Chest pain , pleuritic, syncope.    Medical history and family sleep history: Recent viral upper respiratory tract infection, history of seizures, remote.  Hyperlipidemia, obesity.  It appears that the patient had a single seizure event.  No single loss of consciousness question of syncope versus seizure. 2 brothers with CAD in their 85s   Social history: married,  He works as a Optometrist.  Wife is a Pharmacist, hospital, 2nd grade.  Non smoker.  Non drinker, caffeine - uses 1 cup a day of coffee, he does not consume soda, iced teas or energy drinks.  Review of Systems: Out of a complete 14 system review, the patient complains of only the following symptoms, and all other reviewed systems are negative.  He still feels sleep deprived, due to nocturnal cough. SOB with URI,     Social History   Socioeconomic History  . Marital status: Married    Spouse name: tracy  . Number  of children: 2  . Years of education: college  . Highest education level: Not on file  Social Needs  . Financial resource strain: Not on file  . Food insecurity - worry: Not on file  . Food insecurity - inability: Not on file  . Transportation needs - medical: Not on file  . Transportation needs - non-medical: Not on file  Occupational History  . Occupation: Artist  Tobacco Use  . Smoking status: Never Smoker  . Smokeless tobacco: Never Used  Substance and Sexual Activity  . Alcohol use: Yes  . Drug use: No  . Sexual activity: Not on file  Other Topics Concern  . Not on file  Social History Narrative  . Not on file     Family History  Problem Relation Age of Onset  . Cancer Mother        lung  . Cancer Father        lung  . Bipolar disorder Brother        paranoid manic, depressive bipolar  . Heart disease Brother 88       MI  . Stroke Paternal Grandfather   . Dementia Maternal Grandmother     Past Medical History:  Diagnosis Date  . Epileptic seizure ([place]) 02/21/2013   Single event.   . Hyperlipidemia   . Seizures ([place])   . UTI (urinary tract infection)     Past Surgical History:  Procedure Laterality Date  . NASAL SEPTUM SURGERY  1975   deviated    Current Outpatient Medications  Medication Sig Dispense Refill  . colchicine 0.6 MG tablet Take 1 tablet (0.6 mg total) by mouth 2 (two) times daily as needed. For gout. 60 tablet 3  . Omega-3 Fatty Acids (FISH OIL) 1200 MG CAPS Take 2 capsules by mouth 2 (two) times daily.     . simvastatin (ZOCOR) 20 MG tablet TAKE 1 TABLET BY MOUTH DAILY AT BEDTIME (Patient taking differently: take 1 tab 2-3 times a week.) 90 tablet 3  . tamsulosin (FLOMAX) 0.4 MG CAPS capsule Take 1 capsule (0.4 mg total) by mouth daily. (Patient taking differently: Take 0.4 mg by mouth daily. Take once every 2-3 days) 30 capsule 11  . triamcinolone cream (KENALOG) 0.1 % Apply 1 application topically 2 (two) times daily as needed. Compound 1:1 with Eucerin and apply to affected skin bid prn 454 g 1  . TURMERIC PO Take 1,000 mg by mouth.    . zolpidem (AMBIEN) 10 MG tablet Take 1 tablet (10 mg total) at bedtime as needed by mouth. For sleep 30 tablet 0   No current facility-administered medications for this visit.     Allergies as of 02/24/2017 - Review Complete 02/24/2017  Allergen Reaction Noted  . Penicillins Other (See Comments) 09/25/2010    Vitals: BP (!) 146/86 (Patient Position: Standing)   Pulse 68   Ht 5\' 10"  (1.778 m)   Wt 201 lb (91.2 kg)   BMI 28.84 kg/m  Last Weight:  Wt Readings from Last 1 Encounters:  02/24/17 201 lb (91.2 kg)    KDT:OIZT mass index is 28.84 kg/m.     Last Height:   Ht Readings from Last 1 Encounters:  02/24/17 5\' 10"  (1.778 m)    Physical exam:  General: The patient is awake, alert and appears not in acute distress. The patient is well groomed. Head: Normocephalic, atraumatic. Neck is supple. Mallampati 3,   Cardiovascular:  Regular rate and rhythm , without  murmurs or  carotid bruit, and without distended neck veins. Respiratory: Lungs are clear to auscultation. He gets easily light handed and winded.  Skin:  Without evidence of edema, or rash Trunk: BMI is 28.9. The patient's posture is erect   Neurologic exam : The patient is awake and alert, oriented to place and time.   Attention span & concentration ability appears normal.  Speech is fluent,  without  dysarthria, dysphonia or aphasia.  Mood and affect are appropriate.  Cranial nerves: Pupils are equal and briskly reactive to light. Funduscopic exam without evidence of pallor or edema. Extraocular movements  in vertical and horizontal planes intact and without nystagmus. Visual fields by finger perimetry are intact. Hearing to finger rub intact.   Facial sensation intact to fine touch.  Facial motor strength is symmetric and tongue and uvula move midline. Shoulder shrug is symmetrical.  Motor exam:  Normal tone, muscle bulk and symmetric strength in all extremities. Sensory:  Fine touch, pinprick and vibration were tested in all extremities. Proprioception tested in the upper extremities was normal. Coordination: Rapid alternating movements in the fingers/hands was normal. Finger-to-nose maneuver  normal without evidence of ataxia, dysmetria or tremor. Gait and station: Patient walks without assistive device and is able unassisted to climb up to the exam table. Strength within normal limits. Stance is stable and normal. Turns with  3 Steps. Romberg testing is  negative. Deep tendon reflexes: in the upper and lower extremities are brisk,  symmetric and intact. Babinski maneuver response is  downgoing.  Assessment:  After physical and neurologic examination, review of laboratory studies,  Personal review of imaging studies, reports of other /same  Imaging studies, results of polysomnography and / or neurophysiology testing and pre-existing records as far as provided in visit.,  His PCP  records show a normal EKG.   Assessment is : syncope  With prolonged loss of awareness.   1) I do share the concern that Mr. Mcpheeters may have had an isolated seizure and I do think it was a provoked event he was sleep deprived took decongestants which lower the seizure threshold, and he had felt miserable for several weeks.  I would like to follow with an EEG, I will not place the patient on any preventive seizure medication at this time.  I will ask him not to use decongestants especially not decongestants with BP increasing potential.  No Delsyn !  2)  Hydration.  Good for lightheadedness, and  Netti pot - nasal saline spray,  and simple mucinex( not D )   3) insomnia- stop ambien  After sleep walking episodes. Start prn low dose Xanax.  I would allow this 3-4 times a month.   The patient was advised of the nature of the diagnosed disorder , the treatment options and the  risks for general health and wellness arising from not treating the condition.   I spent more than 45 minutes of face to face time with the patient.  Greater than 50% of time was spent in counseling and coordination of care. We have discussed the diagnosis and differential and I answered the patient's questions.    Plan:  Treatment plan and additional workup : EEG, hydration, netti pot, nasal saline spray, avoid Garnette Scheuermann, MD 63/78/5885, 0:27 PM  Certified in Neurology by ABPN Certified in [place] by [facility] [address], [place], [place] [address]

## 2017-02-24 NOTE — Patient Instructions (Signed)
Possible reaction to [facility]- stop decongestants.  Hydrate well.  If you can't sleep , use xanax- not Ambien.

## 2017-03-03 ENCOUNTER — Ambulatory Visit: Payer: Commercial Managed Care - PPO | Admitting: Neurology

## 2017-03-03 DIAGNOSIS — R55 Syncope and collapse: Secondary | ICD-10-CM

## 2017-03-03 DIAGNOSIS — R7303 Prediabetes: Secondary | ICD-10-CM

## 2017-03-03 DIAGNOSIS — Z8669 Personal history of other diseases of the nervous system and sense organs: Secondary | ICD-10-CM

## 2017-03-05 NOTE — Procedures (Signed)
   HISTORY: 62 years old male presented with fainting spells.  TECHNIQUE:  16 channel EEG was performed based on standard 10-16 international system. One channel was dedicated to EKG, which has demonstrates normal sinus rhythm of 72 beats per minutes.  Upon awakening, the posterior background activity was well-developed with mixed alpha and smaller amplitude beta range activites, reactive to eye opening and closure.  There was no evidence of epileptiform discharge.  Photic stimulation was performed, which induced a symmetric photic driving.  Hyperventilation was performed, there was no abnormality elicit.  No sleep was achieved.  CONCLUSION: This is a  Normal awake EEG.  There is no electrodiagnostic evidence of epileptiform discharge.  Marcial Pacas, M.D. Ph.D.  [facility] [place], [place] [address] Phone: [phone] Fax:      [phone]

## 2017-03-08 ENCOUNTER — Telehealth: Payer: Self-pay | Admitting: Neurology

## 2017-03-08 NOTE — Telephone Encounter (Signed)
Called to make the patient aware of the EEG results being normal. No answer on cell or home phone. LVM on home phone per DPR. If pt calls back please advise EEG was normal.

## 2017-03-08 NOTE — Telephone Encounter (Signed)
-----   Message from Larey Seat, MD sent at 03/08/2017  8:24 AM EST ----- Normal EEG

## 2017-03-09 ENCOUNTER — Ambulatory Visit (INDEPENDENT_AMBULATORY_CARE_PROVIDER_SITE_OTHER): Payer: Commercial Managed Care - PPO | Admitting: Family Medicine

## 2017-03-09 ENCOUNTER — Encounter: Payer: Self-pay | Admitting: Family Medicine

## 2017-03-09 VITALS — BP 130/80 | HR 62 | Temp 98.4°F | Ht 70.0 in | Wt 202.1 lb

## 2017-03-09 DIAGNOSIS — R5383 Other fatigue: Secondary | ICD-10-CM | POA: Diagnosis not present

## 2017-03-09 DIAGNOSIS — Z8249 Family history of ischemic heart disease and other diseases of the circulatory system: Secondary | ICD-10-CM | POA: Diagnosis not present

## 2017-03-09 DIAGNOSIS — Z23 Encounter for immunization: Secondary | ICD-10-CM | POA: Diagnosis not present

## 2017-03-09 DIAGNOSIS — Z125 Encounter for screening for malignant neoplasm of prostate: Secondary | ICD-10-CM | POA: Diagnosis not present

## 2017-03-09 DIAGNOSIS — Z0000 Encounter for general adult medical examination without abnormal findings: Secondary | ICD-10-CM

## 2017-03-09 LAB — CBC WITH DIFFERENTIAL/PLATELET
Basophils Absolute: 0 10*3/uL (ref 0.0–0.1)
Basophils Relative: 0.9 % (ref 0.0–3.0)
Eosinophils Absolute: 0.2 10*3/uL (ref 0.0–0.7)
Eosinophils Relative: 2.8 % (ref 0.0–5.0)
HCT: 44.3 % (ref 39.0–52.0)
Hemoglobin: 15.4 g/dL (ref 13.0–17.0)
Lymphocytes Relative: 27.6 % (ref 12.0–46.0)
Lymphs Abs: 1.5 10*3/uL (ref 0.7–4.0)
MCHC: 34.7 g/dL (ref 30.0–36.0)
MCV: 93 fl (ref 78.0–100.0)
Monocytes Absolute: 0.5 10*3/uL (ref 0.1–1.0)
Monocytes Relative: 8.7 % (ref 3.0–12.0)
Neutro Abs: 3.2 10*3/uL (ref 1.4–7.7)
Neutrophils Relative %: 60 % (ref 43.0–77.0)
Platelets: 179 10*3/uL (ref 150.0–400.0)
RBC: 4.76 Mil/uL (ref 4.22–5.81)
RDW: 13.2 % (ref 11.5–15.5)
WBC: 5.3 10*3/uL (ref 4.0–10.5)

## 2017-03-09 LAB — BASIC METABOLIC PANEL
BUN: 13 mg/dL (ref 6–23)
CO2: 26 mEq/L (ref 19–32)
Calcium: 9.5 mg/dL (ref 8.4–10.5)
Chloride: 104 mEq/L (ref 96–112)
Creatinine, Ser: 0.99 mg/dL (ref 0.40–1.50)
GFR: 81.08 mL/min (ref >60.00)
Glucose, Bld: 104 mg/dL — ABNORMAL HIGH (ref 70–99)
Potassium: 4.4 mEq/L (ref 3.5–5.1)
Sodium: 140 mEq/L (ref 135–145)

## 2017-03-09 LAB — LIPID PANEL
Cholesterol: 245 mg/dL — ABNORMAL HIGH (ref 0–200)
HDL: 46.8 mg/dL (ref >39.00)
LDL Cholesterol: 167 mg/dL — ABNORMAL HIGH (ref 0–99)
NonHDL: 198.65
Total CHOL/HDL Ratio: 5
Triglycerides: 157 mg/dL — ABNORMAL HIGH (ref 0.0–149.0)
VLDL: 31.4 mg/dL (ref 0.0–40.0)

## 2017-03-09 LAB — PSA: PSA: 5.51 ng/mL — ABNORMAL HIGH (ref 0.10–4.00)

## 2017-03-09 LAB — TSH: TSH: 1.67 u[IU]/mL (ref 0.35–4.50)

## 2017-03-09 LAB — HEPATIC FUNCTION PANEL
ALT: 27 U/L (ref 0–53)
AST: 23 U/L (ref 0–37)
Albumin: 4.9 g/dL (ref 3.5–5.2)
Alkaline Phosphatase: 54 U/L (ref 39–117)
Bilirubin, Direct: 0.1 mg/dL (ref 0.0–0.3)
Total Bilirubin: 1 mg/dL (ref 0.2–1.2)
Total Protein: 7.3 g/dL (ref 6.0–8.3)

## 2017-03-09 MED ORDER — SILDENAFIL CITRATE 100 MG PO TABS: 50.0000 mg | ORAL_TABLET | Freq: Every day | ORAL | 11 refills | Status: AC | PRN

## 2017-03-09 MED ORDER — COLCHICINE 0.6 MG PO TABS: 0.6000 mg | ORAL_TABLET | Freq: Two times a day (BID) | ORAL | 3 refills | Status: DC | PRN

## 2017-03-09 MED ORDER — SIMVASTATIN 20 MG PO TABS: 20.0000 mg | ORAL_TABLET | Freq: Every day | ORAL | 3 refills | Status: DC

## 2017-03-09 NOTE — Patient Instructions (Signed)
Consider baby aspirin 81 mg daily We are setting up cardiology referral.

## 2017-03-09 NOTE — Progress Notes (Signed)
Subjective:     Patient ID: Curtis Sheppard, male   DOB: 07-21-1955, 62 y.o.   MRN: [id number]  HPI Patient seen for physical exam. He had syncopal episode and we referred to neurology with concern for possible seizure. They felt this might have been nonepileptic seizure. He had been taking a lot of Delsym and over-the-counter decongestants. He's had no events since initial syncope episode. EEG showed no seizure activity. He was not placed on any anticonvulsants. He has follow-up scheduled with neurology.  Patient states he had virus/cold months ago and has had some extreme lethargy since then. He has gotten back to exercise but states when he gets his heart rate up over 120 he tends have some lightheadedness which is new. He is concerned because of family history of premature heart disease. One brother had coronary disease in his 63s and another med coronary disease his 9s. Mom died age 48 of dementia. Father died of lung cancer age 35 possible pulmonary embolus.  Patient denies any previous stress testing. No chest pain. Does have some fatigue with exercise. No dyspnea. Nonsmoker  He has history of prediabetes. He has history of dyslipidemia and takes simvastatin  Past Medical History:  Diagnosis Date  . Epileptic seizure ([place]) 02/21/2013   Single event.   . Hyperlipidemia   . Seizures ([place])   . UTI (urinary tract infection)    Past Surgical History:  Procedure Laterality Date  . NASAL SEPTUM SURGERY  1975   deviated    reports that  has never smoked. he has never used smokeless tobacco. He reports that he drinks alcohol. He reports that he does not use drugs. family history includes Bipolar disorder in his brother; Cancer in his father and mother; Dementia in his maternal grandmother; Heart disease (age of onset: 62) in his brother; Stroke in his paternal grandfather. Allergies  Allergen Reactions  . Penicillins Other (See Comments)    Unknown      Review of Systems   Constitutional: Positive for fatigue. Negative for activity change, appetite change and fever.  HENT: Negative for congestion, ear pain and trouble swallowing.   Eyes: Negative for pain and visual disturbance.  Respiratory: Negative for cough, shortness of breath and wheezing.   Cardiovascular: Negative for chest pain and palpitations.  Gastrointestinal: Negative for abdominal distention, abdominal pain, blood in stool, constipation, diarrhea, nausea, rectal pain and vomiting.  Endocrine: Negative for polydipsia and polyuria.  Genitourinary: Negative for dysuria, hematuria and testicular pain.  Musculoskeletal: Negative for arthralgias and joint swelling.  Skin: Negative for rash.  Neurological: Negative for dizziness, syncope and headaches.  Hematological: Negative for adenopathy.  Psychiatric/Behavioral: Negative for confusion and dysphoric mood.       Objective:   Physical Exam  Constitutional: He is oriented to person, place, and time. He appears well-developed and well-nourished. No distress.  HENT:  Head: Normocephalic and atraumatic.  Right Ear: External ear normal.  Left Ear: External ear normal.  Mouth/Throat: Oropharynx is clear and moist.  Eyes: Conjunctivae and EOM are normal. Pupils are equal, round, and reactive to light.  Neck: Normal range of motion. Neck supple. No thyromegaly present.  Cardiovascular: Normal rate, regular rhythm and normal heart sounds.  No murmur heard. Pulmonary/Chest: No respiratory distress. He has no wheezes. He has no rales.  Abdominal: Soft. Bowel sounds are normal. He exhibits no distension and no mass. There is no tenderness. There is no rebound and no guarding.  Musculoskeletal: He exhibits no edema.  Lymphadenopathy:  He has no cervical adenopathy.  Neurological: He is alert and oriented to person, place, and time. He displays normal reflexes. No cranial nerve deficit.  Skin: No rash noted.  Psychiatric: He has a normal mood and  affect.       Assessment:     Physical exam. He has positive family history of premature CAD as above. Recent vague symptoms of increased fatigue with exercise. Recent syncopal event as above concerning for possible non-epileptic seizure-followed by neurology  History of [facility] which [facility]:     -Set up lab work-including PSA -Refilled several medications today including simvastatin and colchicine -Set up cardiology referral. Positive family history of premature CAD as above and nonspecific lightheadedness and fatigue recently with exercise in absence of any chest pain -Flu vaccine and tetanus booster given  Eulas Post MD [facility]

## 2017-03-23 ENCOUNTER — Other Ambulatory Visit (HOSPITAL_COMMUNITY): Payer: Self-pay | Admitting: Urology

## 2017-03-23 DIAGNOSIS — R9721 Rising PSA following treatment for malignant neoplasm of prostate: Secondary | ICD-10-CM | POA: Diagnosis not present

## 2017-03-23 DIAGNOSIS — Z8546 Personal history of malignant neoplasm of prostate: Secondary | ICD-10-CM | POA: Diagnosis not present

## 2017-03-23 DIAGNOSIS — C61 Malignant neoplasm of prostate: Secondary | ICD-10-CM

## 2017-04-06 ENCOUNTER — Encounter (HOSPITAL_COMMUNITY)
Admission: RE | Admit: 2017-04-06 | Discharge: 2017-04-06 | Disposition: A | Discharge status: Home / Self Care | Payer: Commercial Managed Care - PPO | Source: Ambulatory Visit | Attending: Urology | Admitting: Urology

## 2017-04-06 DIAGNOSIS — C61 Malignant neoplasm of prostate: Secondary | ICD-10-CM | POA: Diagnosis not present

## 2017-04-06 MED ORDER — AXUMIN (FLUCICLOVINE F 18) INJECTION
9.7000 | Freq: Once | INTRAVENOUS | Status: AC
  Administered 2017-04-06: 9.7 via INTRAVENOUS

## 2017-04-13 DIAGNOSIS — C61 Malignant neoplasm of prostate: Secondary | ICD-10-CM | POA: Diagnosis not present

## 2017-04-23 ENCOUNTER — Ambulatory Visit (INDEPENDENT_AMBULATORY_CARE_PROVIDER_SITE_OTHER)
Admission: RE | Admit: 2017-04-23 | Discharge: 2017-04-23 | Disposition: A | Discharge status: Home / Self Care | Payer: Commercial Managed Care - PPO | Source: Ambulatory Visit | Attending: Internal Medicine | Admitting: Internal Medicine

## 2017-04-23 ENCOUNTER — Encounter: Payer: Self-pay | Admitting: Internal Medicine

## 2017-04-23 ENCOUNTER — Ambulatory Visit: Payer: Commercial Managed Care - PPO | Admitting: Internal Medicine

## 2017-04-23 VITALS — BP 140/84 | HR 65 | Ht 70.0 in | Wt 197.8 lb

## 2017-04-23 DIAGNOSIS — R55 Syncope and collapse: Secondary | ICD-10-CM | POA: Diagnosis not present

## 2017-04-23 DIAGNOSIS — R5383 Other fatigue: Secondary | ICD-10-CM

## 2017-04-23 DIAGNOSIS — Z8249 Family history of ischemic heart disease and other diseases of the circulatory system: Secondary | ICD-10-CM

## 2017-04-23 DIAGNOSIS — R002 Palpitations: Secondary | ICD-10-CM | POA: Diagnosis not present

## 2017-04-23 NOTE — Progress Notes (Signed)
Cardiology Office Note   Date:  04/26/2017   ID:  Curtis Sheppard, DOB 11-19-1955, MRN [id number]  PCP:  Curtis Post, MD  Cardiologist:   Dorris Carnes, MD   Pt referred by Dr Curtis Sheppard for syncope   History of Present Illness: Curtis Sheppard is a 62 y.o. male with a history  syncope   Pt had a viral syndrome in October 2018  After that complained of severe fatigue  During this time had a syncopat spell  This occurred in Novemder.  Woke up  Exercised   Took shoer.  Drank fluids  Worked in garden  There he had a sudden loss of consciousness with minimal prodrome    [address] down  Not sure how long   Seen in neuro after  (hx of seizures in past)  Work up negative (EEG, labs) He has had no further syncopal spells   He has finally started to get back into exercising but says he gets very fatigued   HR increases    Last summer he was running, exercising regularly without a problem  Fhx of CAD (brother)       Current Meds  Medication Sig  . ALPRAZolam (XANAX) 0.5 MG tablet Take 1 tablet (0.5 mg total) by mouth at bedtime as needed for anxiety.  . colchicine 0.6 MG tablet Take 1 tablet (0.6 mg total) by mouth 2 (two) times daily as needed. For gout.  . Omega-3 Fatty Acids (FISH OIL) 1200 MG CAPS Take 2 capsules by mouth 2 (two) times daily.   . sildenafil (VIAGRA) 100 MG tablet Take 0.5-1 tablets (50-100 mg total) by mouth daily as needed for erectile dysfunction.  . simvastatin (ZOCOR) 20 MG tablet Take 1 tablet (20 mg total) by mouth at bedtime.  . tamsulosin (FLOMAX) 0.4 MG CAPS capsule Take 1 capsule (0.4 mg total) by mouth daily. (Patient taking differently: Take 0.4 mg by mouth daily. Take once every 2-3 days)  . triamcinolone (NASACORT) 55 MCG/ACT AERO nasal inhaler Place 2 sprays into the nose at bedtime as needed.  . triamcinolone cream (KENALOG) 0.1 % Apply 1 application topically 2 (two) times daily as needed. Compound 1:1 with Eucerin and apply to affected skin bid prn  .  TURMERIC PO Take 1,000 mg by mouth.     Allergies:   Penicillins   Past Medical History:  Diagnosis Date  . Epileptic seizure ([place]) 02/21/2013   Single event.   . Hyperlipidemia   . Seizures ([place])   . UTI (urinary tract infection)     Past Surgical History:  Procedure Laterality Date  . NASAL SEPTUM SURGERY  1975   deviated     Social History:  The patient  reports that  has never smoked. he has never used smokeless tobacco. He reports that he drinks alcohol. He reports that he does not use drugs.   Family History:  The patient's family history includes Bipolar disorder in his brother; Cancer in his father and mother; Dementia in his maternal grandmother; Heart disease (age of onset: 12) in his brother; Stroke in his paternal grandfather.    ROS:  Please see the history of present illness. All other systems are reviewed and  Negative to the above problem except as noted.    PHYSICAL EXAM: VS:  BP 140/84   Pulse 65   Ht 5\' 10"  (1.778 m)   Wt 197 lb 12.8 oz (89.7 kg)   SpO2 98%   BMI 28.38 kg/m   Orthostatic:  BP 128/76 P 60   Sitting 128/78 P 65   Standing 130/80 P 76  Standing 3 min 124/80  P 74   GEN: Well nourished, well developed, in no acute distress  HEENT: normal  Neck: no JVD, carotid bruits, or masses Cardiac: RRR; no murmurs, rubs, or gallops,no edema  Respiratory:  clear to auscultation bilaterally, normal work of breathing GI: soft, nontender, nondistended, + BS  No hepatomegaly  MS: no deformity Moving all extremities   Skin: warm and dry, no rash Neuro:  Strength and sensation are intact Psych: euthymic mood, full affect   EKG:  EKG is not ordered today.  On 01/05/17:  SR 70      Lipid Panel    Component Value Date/Time   CHOL 245 (H) 03/09/2017 1047   TRIG 157.0 (H) 03/09/2017 1047   HDL 46.80 03/09/2017 1047   CHOLHDL 5 03/09/2017 1047   VLDL 31.4 03/09/2017 1047   LDLCALC 167 (H) 03/09/2017 1047   LDLDIRECT 99.0 03/25/2015 0905      Wt  Readings from Last 3 Encounters:  04/23/17 197 lb 12.8 oz (89.7 kg)  03/09/17 202 lb 1.6 oz (91.7 kg)  02/24/17 201 lb (91.2 kg)      ASSESSMENT AND PLAN:  Pt is a 62 yo who presents on referral for fatigue, syncope Pt was doing good until a viral infection last fall  Soon after that he had a syncopal event Then he was fatigued   He has had no further syncope   Deneis dizziness  He does however note that as he starts to exercise his heart rate increases signif   New for him   SOB with activity On exam, he has mild increase in heart rate on standing  Not significatn  Overall, I am impressed that the pt has some form of autonomic dysfunction from the viral infection   I would follow  Encouraged hydration and salt  Encouraged increased physical acitvity  The pt is concerned because brother had CAD   Will Set up for calcium score CT to define  Burden  F/ U with pt for symptoms as he trains     Current medicines are reviewed at length with the patient today.  The patient does not have concerns regarding medicines.  Signed, Dorris Carnes, MD  04/26/2017 12:00 AM    [address], [place], [place]  [address] Phone: [phone]; Fax: [phone]

## 2017-04-23 NOTE — Patient Instructions (Addendum)
Your physician recommends that you continue on your current medications as directed. Please refer to the Current Medication list given to you today.  Please schedule a calcium score CT scan.  This can be done at our office.  This is not covered by insurance.  You will have a $150 out of pocket fee.p

## 2017-05-06 ENCOUNTER — Other Ambulatory Visit: Payer: Self-pay | Admitting: *Deleted

## 2017-05-06 DIAGNOSIS — E785 Hyperlipidemia, unspecified: Secondary | ICD-10-CM

## 2017-05-06 DIAGNOSIS — I2510 Atherosclerotic heart disease of native coronary artery without angina pectoris: Secondary | ICD-10-CM

## 2017-05-06 NOTE — Progress Notes (Signed)
Notes recorded by Fay Records, MD on 04/28/2017 at 8:52 AM EST Reviewed with pt He does have plaquing on coronary arteries He is back on simvistatin I would recomm fasting lipomed panel with LDL about 3 months from starting   Order placed.  Will place recall for lab as reminder for patient.

## 2017-05-26 ENCOUNTER — Encounter: Payer: Self-pay | Admitting: Neurology

## 2017-05-26 ENCOUNTER — Ambulatory Visit: Payer: Commercial Managed Care - PPO | Admitting: Neurology

## 2017-05-26 VITALS — BP 153/80 | HR 54 | Ht 70.0 in | Wt 197.0 lb

## 2017-05-26 DIAGNOSIS — F5102 Adjustment insomnia: Secondary | ICD-10-CM | POA: Diagnosis not present

## 2017-05-26 DIAGNOSIS — R351 Nocturia: Secondary | ICD-10-CM | POA: Diagnosis not present

## 2017-05-26 DIAGNOSIS — R0683 Snoring: Secondary | ICD-10-CM | POA: Insufficient documentation

## 2017-05-26 MED ORDER — DIAZEPAM 2 MG PO TABS: ORAL_TABLET | ORAL | 3 refills | Status: DC

## 2017-05-26 NOTE — Progress Notes (Signed)
SLEEP MEDICINE CLINIC   Provider:  Larey Seat, [place] D  Primary Care Physician:  Curtis Post, MD   Referring Provider: Eulas Post, MD    Chief Complaint  Patient presents with  . Follow-up    pt alone, rm 10. pt states things are good. no issues with dizziness or syncope. complains of insomnia    HPI:  Curtis Sheppard is a 62 y.o. male , seen here in a referral from Curtis Sheppard for evaluation of a fainting spell, a single event.   I had the pleasure of following with Curtis Sheppard before, as he had a history of a remote seizure disorder an epileptic event in 2000 and 2001.  His primary care physician was concerned that this could be a new neurologic event or that the syncope could be related.  He stated that based on history he doubts that our patient has vagal vagal syncope or orthostatic origin of syncope-therefore reinitiating of workup for seizure-rule out seizure.  An event monitor was discussed in case that our neurologic workup is negative. The day of the syncope was the of November of this year, occurred in the morning.  He felt fine when he got up in the morning he went exercising for about an hour which is his daily routine then took a shower drink fluids and went outside around 9:30 AM to work in the garden.  He felt suddenly lightheaded and apparently lost consciousness, he fell down but has no recollection of how and when.  There was no incontinence no tongue biting and there was nobody witnessing the event.  He estimated that he may be out for as long as 15 minutes but cannot be sure he felt somewhat groggy afterwards possibly over 30 minutes of the question now is if this was a postictal episode. He has not had a flu shot. He had a viral infection since October 4th , hacking cough, non productive. ,no  febrile episodes, but chills. He was  sleep deprived. ATB were prescribed and his head "cleared up ".  The patient states that he was sleep impaired or deprived  after weeks of coughing, and he took a decongestant at the time that he had his seizure within in the same couple of days. And herecently had 2 sleep walking episodes- on [place]!   Chief complaint according to patient :Chest pain , pleuritic, syncope. Insomnia . Xanax helps.   Medical history and family sleep history: Recent viral upper respiratory tract infection, history of seizures, remote.  Hyperlipidemia, obesity.  It appears that the patient had a single seizure event.  No single loss of consciousness question of syncope versus seizure. 2 brothers with CAD in their 84s   Social history: married,  He works as a Optometrist.  Wife is a Pharmacist, hospital, 2nd grade.  Non smoker.  Non drinker, caffeine - uses 1 cup a day of coffee, he does not consume soda, iced teas or energy drinks.   05-26-2017, I have the pleasure of seeing Curtis Sheppard today, who underwent an EEG the day after New Year's 2019 which was interpreted by Dr. Annamaria Sheppard.  She did not see any electrodiagnostic evidence of epileptiform discharge and called is a normal EEG.  Hyperventilation and photic stimulation were performed.  We did not assume that the patient had a seizure leading to his syncope spell.  On the other hand the patient has done well until recently when he learned that his prostate cancer has returned and he has  now more or less adjustment related insomnia.  His fatigue score was endorsed at 19 point, his Epworth Sleepiness Scale at 6 points.  He has been given Xanax to help him with sleep and indeed it will be easier to go to sleep on medication, he sleeps for 6 hours.  Xanax also seems to control his overactive bladder- nocturia control resulting. He is not groggy as he was on Ambien.  He may benefit from diazepam if he has an overactive bladder, indeed.   Review of Systems: Out of a complete 14 system review, the patient complains of only the following symptoms, and all other reviewed systems are negative.  He still feels sleep  deprived, due to nocturnal cough. SOB with URI,  Insomnia with nocturia, overactive bladder. prostate cancer return after 'cyber-knife treatment" , now having three spots on the surface of the prostate, following with PSA and hormone treatment.  He will see Curtis Sheppard, Da Vinci procedure.     Social History   Socioeconomic History  . Marital status: Married    Spouse name: Curtis Sheppard  . Number of children: 2  . Years of education: college  . Highest education level: Not on file  Occupational History  . Occupation: Visual merchandiser Needs  . Financial resource strain: Not on file  . Food insecurity:    Worry: Not on file    Inability: Not on file  . Transportation needs:    Medical: Not on file    Non-medical: Not on file  Tobacco Use  . Smoking status: Never Smoker  . Smokeless tobacco: Never Used  Substance and Sexual Activity  . Alcohol use: Yes  . Drug use: No  . Sexual activity: Not on file  Lifestyle  . Physical activity:    Days per week: Not on file    Minutes per session: Not on file  . Stress: Not on file  Relationships  . Social connections:    Talks on phone: Not on file    Gets together: Not on file    Attends religious service: Not on file    Active member of club or organization: Not on file    Attends meetings of clubs or organizations: Not on file    Relationship status: Not on file  . Intimate partner violence:    Fear of current or ex partner: Not on file    Emotionally abused: Not on file    Physically abused: Not on file    Forced sexual activity: Not on file  Other Topics Concern  . Not on file  Social History Narrative  . Not on file    Family History  Problem Relation Age of Onset  . Cancer Mother        lung  . Cancer Father        lung  . Bipolar disorder Brother        paranoid manic, depressive bipolar  . Heart disease Brother 44       MI  . Stroke Paternal Grandfather   . Dementia Maternal Grandmother      Past Medical History:  Diagnosis Date  . Epileptic seizure ([place]) 02/21/2013   Single event.   . Hyperlipidemia   . Seizures ([place])   . UTI (urinary tract infection)     Past Surgical History:  Procedure Laterality Date  . NASAL SEPTUM SURGERY  1975   deviated    Current Outpatient Medications  Medication Sig Dispense Refill  . ALPRAZolam Duanne Moron)  0.5 MG tablet Take 1 tablet (0.5 mg total) by mouth at bedtime as needed for anxiety. 30 tablet 1  . colchicine 0.6 MG tablet Take 1 tablet (0.6 mg total) by mouth 2 (two) times daily as needed. For gout. 60 tablet 3  . Omega-3 Fatty Acids (FISH OIL) 1200 MG CAPS Take 2 capsules by mouth 2 (two) times daily.     . sildenafil (VIAGRA) 100 MG tablet Take 0.5-1 tablets (50-100 mg total) by mouth daily as needed for erectile dysfunction. 10 tablet 11  . simvastatin (ZOCOR) 20 MG tablet Take 1 tablet (20 mg total) by mouth at bedtime. 90 tablet 3  . tamsulosin (FLOMAX) 0.4 MG CAPS capsule Take 1 capsule (0.4 mg total) by mouth daily. (Patient taking differently: Take 0.4 mg by mouth daily. Take once every 2-3 days) 30 capsule 11  . triamcinolone (NASACORT) 55 MCG/ACT AERO nasal inhaler Place 2 sprays into the nose at bedtime as needed. 1 Inhaler 12  . triamcinolone cream (KENALOG) 0.1 % Apply 1 application topically 2 (two) times daily as needed. Compound 1:1 with Eucerin and apply to affected skin bid prn 454 g 1  . TURMERIC PO Take 1,000 mg by mouth.     No current facility-administered medications for this visit.     Allergies as of 05/26/2017 - Review Complete 05/26/2017  Allergen Reaction Noted  . Penicillins Other (See Comments) 09/25/2010    Vitals: BP (!) 153/80   Pulse (!) 54   Ht 5\' 10"  (1.778 m)   Wt 197 lb (89.4 kg)   BMI 28.27 kg/m  Last Weight:  Wt Readings from Last 1 Encounters:  05/26/17 197 lb (89.4 kg)   BPZ:WCHE mass index is 28.27 kg/m.     Last Height:   Ht Readings from Last 1 Encounters:  05/26/17 5\' 10"   (1.778 m)    Physical exam:  General: The patient is awake, alert and appears not in acute distress. The patient is well groomed. Head: Normocephalic, atraumatic. Neck is supple. Mallampati 3,   Cardiovascular:  Regular rate and rhythm , without  murmurs or carotid bruit, and without distended neck veins. Respiratory: Lungs are clear to auscultation. He gets easily light handed and winded.  Skin:  Without evidence of edema, or rash Trunk: BMI is 28.9. The patient's posture is erect   Neurologic exam : The patient is awake and alert, oriented to place and time.   Attention span & concentration ability appears normal. He is visibly worried.  Speech is fluent, without dysarthria or aphasia.  Mood and affect are depressed .   Cranial nerves: Pupils are equal and briskly reactive to light. Visual fields by finger perimetry are intact. Facial sensation intact to fine touch. Facial motor strength is symmetric and tongue and uvula move midline. Shoulder shrug is symmetrical.  Motor exam:  Normal tone, muscle bulk and symmetric strength in all extremities without evidence of ataxia, dysmetria or tremor. Gait and station: Patient walks without assistive device and is able unassisted to climb up to the exam table. Strength within normal limits. Stance is stable and normal. Turns with  3 Steps. Romberg testing is  negative. Deep tendon reflexes: in the upper and lower extremities are brisk, symmetric and intact.   Assessment:  After physical and neurologic examination, review of laboratory studies,  Personal review of imaging studies, reports of other /same  Imaging studies, results of polysomnography and / or neurophysiology testing and pre-existing records as far as provided in visit.,  His PCP records show a normal EKG.  Normal EEG from 03-03-2017 .   Assessment is :   1)Syncope  - prolonged loss of awareness.  I will ask him not to use decongestants especially not decongestants with BP increasing  potential.  No Delsyn ! Hydration. Good for lightheadedness, and  Netti pot - nasal saline spray,  and simple mucinex( not D )   2) insomnia- stopped Ambien ( sleep walking episodes, grogginess) . Started prn low dose Xanax but took it more frequently. He noted a good effect on nocturia- may need to switch to diazepam.   3) exercise and sleep hygiene. Last PSG was 15 years ago- is snoring . Will order PSG.       The patient was advised of the nature of the diagnosed disorder , the treatment options and the  risks for general health and wellness arising from not treating the condition. I spent more than 25 minutes of face to face time with the patient.  Greater than 50% of time was spent in counseling and coordination of care. We have discussed the diagnosis and differential and I answered the patient's questions.    Plan:  Treatment plan and additional workup : Avoid Ambien- takes Xanax too frequently. Change to diazepam low dose for nocturia control.  PSG ordered.  RV after sleep study positive , othewise in 6 month.     Larey Seat, MD 0/17/4944, 9:67 AM  Certified in Neurology by ABPN Certified in [place] by [facility] [address], [place], [place] [address]

## 2017-06-27 ENCOUNTER — Ambulatory Visit (INDEPENDENT_AMBULATORY_CARE_PROVIDER_SITE_OTHER): Payer: Commercial Managed Care - PPO | Admitting: Neurology

## 2017-06-27 DIAGNOSIS — R0683 Snoring: Secondary | ICD-10-CM

## 2017-06-27 DIAGNOSIS — F5102 Adjustment insomnia: Secondary | ICD-10-CM

## 2017-06-27 DIAGNOSIS — G4761 Periodic limb movement disorder: Secondary | ICD-10-CM

## 2017-06-27 DIAGNOSIS — R351 Nocturia: Secondary | ICD-10-CM

## 2017-07-06 ENCOUNTER — Encounter: Payer: Self-pay | Admitting: Family Medicine

## 2017-07-06 ENCOUNTER — Ambulatory Visit: Payer: Commercial Managed Care - PPO | Admitting: Family Medicine

## 2017-07-06 VITALS — BP 120/80 | HR 67 | Temp 98.3°F | Wt 192.8 lb

## 2017-07-06 DIAGNOSIS — L821 Other seborrheic keratosis: Secondary | ICD-10-CM | POA: Diagnosis not present

## 2017-07-06 NOTE — Patient Instructions (Signed)
Seborrheic Keratosis  Seborrheic keratosis is a common, noncancerous (benign) skin growth. This condition causes waxy, rough, tan, brown, or black spots to appear on the skin. These skin growths can be flat or raised.  What are the causes?  The cause of this condition is not known.  What increases the risk?  This condition is more likely to develop in:  · People who have a family history of seborrheic keratosis.  · People who are 50 or older.  · People who are pregnant.  · People who have had estrogen replacement therapy.    What are the signs or symptoms?  This condition often occurs on the face, chest, shoulders, back, or other areas. These growths:  · Are usually painless, but may become irritated and itchy.  · Can be yellow, brown, black, or other colors.  · Are slightly raised or have a flat surface.  · Are sometimes rough or wart-like in texture.  · Are often waxy on the surface.  · Are round or oval-shaped.  · Sometimes look like they are "stuck on.”  · Often occur in groups, but may occur as a single growth.    How is this diagnosed?  This condition is diagnosed with a medical history and physical exam. A sample of the growth may be tested (skin biopsy). You may need to see a skin specialist (dermatologist).  How is this treated?  Treatment is not usually needed for this condition, unless the growths are irritated or are often bleeding. You may also choose to have the growths removed if you do not like their appearance. Most commonly, these growths are treated with a procedure in which liquid nitrogen is applied to “freeze” off the growth (cryosurgery). They may also be burned off with electricity or cut off.  Follow these instructions at home:  · Watch your growth for any changes.  · Keep all follow-up visits as told by your health care provider. This is important.  · Do not scratch or pick at the growth or growths. This can cause them to become irritated or infected.  Contact a health care provider  if:  · You suddenly have many new growths.  · Your growth bleeds, itches, or hurts.  · Your growth suddenly becomes larger or changes color.  This information is not intended to replace advice given to you by your health care provider. Make sure you discuss any questions you have with your health care provider.  Document Released: 03/21/2010 Document Revised: 07/25/2015 Document Reviewed: 07/04/2014  Elsevier Interactive Patient Education © 2018 Elsevier Inc.

## 2017-07-06 NOTE — Progress Notes (Signed)
  Subjective:     Patient ID: Curtis Sheppard, male   DOB: 02/14/1956, 62 y.o.   MRN: [id number]  HPI Patient has multiple scaly lesions on both thighs and lower legs.  Just noticed some of these recently. Occasional itching. No bleeding. No personal history of skin cancer.  Past Medical History:  Diagnosis Date  . Epileptic seizure ([place]) 02/21/2013   Single event.   . Hyperlipidemia   . Seizures ([place])   . UTI (urinary tract infection)    Past Surgical History:  Procedure Laterality Date  . NASAL SEPTUM SURGERY  1975   deviated    reports that he has never smoked. He has never used smokeless tobacco. He reports that he drinks alcohol. He reports that he does not use drugs. family history includes Bipolar disorder in his brother; Cancer in his father and mother; Dementia in his maternal grandmother; Heart disease (age of onset: 22) in his brother; Stroke in his paternal grandfather. Allergies  Allergen Reactions  . Penicillins Other (See Comments)    Unknown      Review of Systems  Constitutional: Negative for appetite change and unexpected weight change.       Objective:   Physical Exam  Constitutional: He appears well-developed and well-nourished.  Cardiovascular: Normal rate.  Pulmonary/Chest: Effort normal and breath sounds normal.  Skin:  She has several minimally raised symmetric well-demarcated brownish scaly lesions including both thighs and right lower leg. These are consistent with seborrheic keratoses       Assessment:     Benign appearing seborrheic keratoses lower extremities    Plan:     -Reassurance. No further treatment recommended. Follow-up as needed  Eulas Post MD [facility]

## 2017-07-07 ENCOUNTER — Other Ambulatory Visit: Payer: Self-pay | Admitting: Neurology

## 2017-07-07 DIAGNOSIS — F513 Sleepwalking [somnambulism]: Secondary | ICD-10-CM

## 2017-07-07 DIAGNOSIS — C61 Malignant neoplasm of prostate: Secondary | ICD-10-CM

## 2017-07-07 DIAGNOSIS — R0683 Snoring: Secondary | ICD-10-CM

## 2017-07-07 DIAGNOSIS — Z8669 Personal history of other diseases of the nervous system and sense organs: Secondary | ICD-10-CM

## 2017-07-07 DIAGNOSIS — F5102 Adjustment insomnia: Secondary | ICD-10-CM

## 2017-07-07 NOTE — Procedures (Addendum)
PATIENT'S NAME:  Curtis Sheppard, Curtis Sheppard DOB:      1955-08-30      MR#:    [id number]     DATE OF RECORDING: 06/27/2017 REFERRING M.D.:  Carolann Littler, MD Study Performed:   Baseline Polysomnogram with expanded EEG  HISTORY:  Mr. Bessinger has a history of a remote seizure disorder (epileptic event in 2000 and 2001).  His primary care physician was concerned that a recent syncope could be related.  He stated that based on history he doubts that our patient has vago-vagal syncope or orthostatic syncope-therefore reinitiating of workup for seizure-rule out seizure.  He also reports insomnia, needs Xanax to sleep. Loud snoring. SOB at night, and coughing. Normal EEG, recently diagnosed with Prostate cancer recurrence.   The patient endorsed the Epworth Sleepiness Scale at 6/24 points, FSS at 45.   The patient's weight 196 pounds with a height of 70 (inches), resulting in a BMI of 28.1 kg/m2. The patient's neck circumference measured 16.5 inches.  CURRENT MEDICATIONS: Xanax, Colchicine, Viagra, Zocor, Flomax, Nasacort, Kenalog   PROCEDURE:  This is a multichannel digital polysomnogram utilizing the SomnoStar 11.2 system.  Electrodes and sensors were applied and monitored per AASM Specifications.   EEG, EOG, Chin and Limb EMG, were sampled at 200 Hz.  ECG, Snore and Nasal Pressure, Thermal Airflow, Respiratory Effort, CPAP Flow and Pressure, Oximetry was sampled at 50 Hz. Digital video and audio were recorded.      BASELINE STUDY: Lights Out was at 22:25 and Lights On at 05:10.  Total recording time (TRT) was 405.5 minutes, with a total sleep time (TST) of 290 minutes.   The patient's sleep latency was 8.5 minutes.  REM latency was 64.5 minutes.  The sleep efficiency was 71.5 %.     SLEEP ARCHITECTURE: WASO (Wake after sleep onset) was 108.5 minutes.  There were 39.5 minutes in Stage N1, 113 minutes Stage N2, 69 minutes Stage N3 and 68.5 minutes in Stage REM.  The percentage of Stage N1 was 13.6%, Stage N2 was  39.%, Stage N3 was 23.8% and Stage R (REM sleep) was 23.6%.   RESPIRATORY ANALYSIS:  There were a total of 5 respiratory events:  0 apneas and 5 hypopneas with a hypopnea index of 1.0 /hour. The patient also had 0 respiratory event related arousals (RERAs).     The total APNEA/HYPOPNEA INDEX (AHI) was 1.0/hour and the total RESPIRATORY DISTURBANCE INDEX was 1.0 /hour.  4 events occurred in REM sleep and 2 events in NREM. The REM AHI was 3.5 /hour, versus a non-REM AHI of 0.3.The patient spent 30 minutes of total sleep time in the supine position and 260 minutes in non-supine. The supine AHI was 6.0 versus a non-supine AHI of 0.5.  OXYGEN SATURATION & C02:  The Wake baseline 02 saturation was 96%, with the lowest being 88%. Time spent below 89% saturation equaled 1 minute.   PERIODIC LIMB MOVEMENTS:  The patient had a total of 78 Periodic Limb Movements.  The Periodic Limb Movement (PLM) index was 16.1/h and the PLM Arousal index was 3.3 /hour. The arousals were noted as: 38 were spontaneous, 16 were associated with PLMs, and 0 were associated with respiratory events.  Audio and video analysis did not show any abnormal or unusual movements, behaviors, phonations or vocalizations.  Normal sleep EEG.  The patient took two bathroom breaks. Loud Snoring was noted. EKG was in keeping with normal sinus rhythm (NSR). Post-study, the patient indicated that sleep was the same as usual.  IMPRESSION:  1. No evidence of Obstructive Sleep Apnea(OSA) 2. Moderate-severe Periodic Limb Movement Disorder (PLMD) 3. Loud Primary Snoring 4. Normal sleep EEG  RECOMMENDATIONS:  Severe sleep interruption by PLMs. Evaluate for overlapping RLS syndrome, Neuropathy , iron deficiency as causes of PLMs.  No CPAP intervention needed but snoring may be treated with a dental device.    I certify that I have reviewed the entire raw data recording prior to the issuance of this report in accordance with the Standards of  Accreditation of the American Academy of Sleep Medicine (AASM)   Larey Seat, MD  07-07-2017  Diplomat, American Board of Psychiatry and Neurology  Diplomat, American Board of [place] Director, [place] [facility]

## 2017-07-09 ENCOUNTER — Telehealth: Payer: Self-pay | Admitting: Neurology

## 2017-07-09 NOTE — Telephone Encounter (Signed)
PATIENT'S NAME:  Curtis Sheppard, Curtis Sheppard DOB:      Nov 01, 1955      MR#:    [id number]     DATE OF RECORDING: 06/27/2017 REFERRING M.D.:  Carolann Littler, MD Study Performed:   Baseline Polysomnogram with expanded EEG  HISTORY:  Mr. Lamarque has a history of a remote seizure disorder (epileptic event in 2000 and 2001).  His primary care physician was concerned that a recent syncope could be related.  He stated that based on history he doubts that our patient has vago-vagal syncope or orthostatic syncope-therefore reinitiating of workup for seizure-rule out seizure.  He also reports insomnia, needs Xanax to sleep. Loud snoring. SOB at night, and coughing. Normal EEG, recently diagnosed with Prostate cancer recurrence.   The patient endorsed the Epworth Sleepiness Scale at 6/24 points, FSS at 45.   The patient's weight 196 pounds with a height of 70 (inches), resulting in a BMI of 28.1 kg/m2. The patient's neck circumference measured 16.5 inches.  CURRENT MEDICATIONS: Xanax, Colchicine, Viagra, Zocor, Flomax, Nasacort, Kenalog   PROCEDURE:  This is a multichannel digital polysomnogram utilizing the SomnoStar 11.2 system.  Electrodes and sensors were applied and monitored per AASM Specifications.   EEG, EOG, Chin and Limb EMG, were sampled at 200 Hz.  ECG, Snore and Nasal Pressure, Thermal Airflow, Respiratory Effort, CPAP Flow and Pressure, Oximetry was sampled at 50 Hz. Digital video and audio were recorded.      BASELINE STUDY: Lights Out was at 22:25 and Lights On at 05:10.  Total recording time (TRT) was 405.5 minutes, with a total sleep time (TST) of 290 minutes.   The patient's sleep latency was 8.5 minutes.  REM latency was 64.5 minutes.  The sleep efficiency was 71.5 %.     SLEEP ARCHITECTURE: WASO (Wake after sleep onset) was 108.5 minutes.  There were 39.5 minutes in Stage N1, 113 minutes Stage N2, 69 minutes Stage N3 and 68.5 minutes in Stage REM.  The percentage of Stage N1 was 13.6%, Stage N2 was  39.%, Stage N3 was 23.8% and Stage R (REM sleep) was 23.6%.   RESPIRATORY ANALYSIS:  There were a total of 5 respiratory events:  0 apneas and 5 hypopneas with a hypopnea index of 1.0 /hour. The patient also had 0 respiratory event related arousals (RERAs).     The total APNEA/HYPOPNEA INDEX (AHI) was 1.0/hour and the total RESPIRATORY DISTURBANCE INDEX was 1.0 /hour.  4 events occurred in REM sleep and 2 events in NREM. The REM AHI was 3.5 /hour, versus a non-REM AHI of 0.3.The patient spent 30 minutes of total sleep time in the supine position and 260 minutes in non-supine. The supine AHI was 6.0 versus a non-supine AHI of 0.5.  OXYGEN SATURATION & C02:  The Wake baseline 02 saturation was 96%, with the lowest being 88%. Time spent below 89% saturation equaled 1 minute.   PERIODIC LIMB MOVEMENTS:  The patient had a total of 78 Periodic Limb Movements.  The Periodic Limb Movement (PLM) index was 16.1/h and the PLM Arousal index was 3.3 /hour. The arousals were noted as: 38 were spontaneous, 16 were associated with PLMs, and 0 were associated with respiratory events.  Audio and video analysis did not show any abnormal or unusual movements, behaviors, phonations or vocalizations.  Normal sleep EEG.  The patient took two bathroom breaks. Loud Snoring was noted. EKG was in keeping with normal sinus rhythm (NSR). Post-study, the patient indicated that sleep was the same as usual.  IMPRESSION:  1. No evidence of Obstructive Sleep Apnea(OSA) 2. Moderate-severe Periodic Limb Movement Disorder (PLMD) 3. Loud Primary Snoring 4. Normal sleep EEG  RECOMMENDATIONS:  Severe sleep interruption by PLMs. Evaluate for overlapping RLS syndrome, Neuropathy , iron deficiency as causes of PLMs.  No CPAP intervention needed but snoring may be treated with a dental device.    I certify that I have reviewed the entire raw data recording prior to the issuance of this report in accordance with the Standards of  Accreditation of the American Academy of Sleep Medicine (AASM)   Larey Seat, MD  07-07-2017  Diplomat, American Board of Psychiatry and Neurology  Diplomat, American Board of [place] Director, [place] [facility]

## 2017-07-14 NOTE — Telephone Encounter (Signed)
Called the pt to review the sleep study. No answer. LVM for the patient to call back.

## 2017-07-19 DIAGNOSIS — C61 Malignant neoplasm of prostate: Secondary | ICD-10-CM | POA: Diagnosis not present

## 2017-07-21 NOTE — Telephone Encounter (Signed)
Made a 2nd attempt to reach pt to discuss sleep study. No answer. LVM for the pt to call back.

## 2017-07-22 NOTE — Telephone Encounter (Signed)
Called the pt, no answer. LVM for the pt to call back.  Dr Dohmeier states that she would not like the patient the diazepam is a medication that is a muscle relaxer and can be helpful with the restlessness in his extremities that were present in his sleep and she would rather treat the patient with that and for that. She would ask that the pt be open to a cognitive behavior referral as they treat insomnia if that is his most concern. We can place that referral for him if he is open to that. If the patient would like to continue to complete the workup for RLS or neuropathy we can schedule a follow up apt to discuss the further testing and treatment plans.

## 2017-07-22 NOTE — Telephone Encounter (Signed)
Patient returned call and I was able to go over the sleep study results with him. I informed him there was no sleep apnea present, loud snoring was noted, and normal EKG. I did inform the pt of him having restlessness in his extremities. Dr Dohmeier recommends a work up for the restlessness for RLS or neuropathy. At this time pt declined dental referral for snoring and states that he normally takes his medication at bedtime and doesn't have any restlessness with that. Pt states he would like Dr Brett Fairy to continue his xanax prescription. I looked at med list and only see diazepam ordered. The patient states that Dr Brett Fairy had switched him to that but he feels the xanax works better and would like to go back to that. I instructed him that I would have to inform Dr Brett Fairy of that and question with her. Pt verbalized understanding.

## 2017-07-29 ENCOUNTER — Ambulatory Visit: Payer: Commercial Managed Care - PPO | Admitting: Family Medicine

## 2017-07-29 ENCOUNTER — Encounter: Payer: Self-pay | Admitting: Family Medicine

## 2017-07-29 VITALS — BP 151/82 | HR 62 | Temp 97.6°F | Ht 70.0 in | Wt 194.0 lb

## 2017-07-29 DIAGNOSIS — M25561 Pain in right knee: Secondary | ICD-10-CM | POA: Insufficient documentation

## 2017-07-29 MED ORDER — DICLOFENAC SODIUM 2 % TD SOLN: 1.0000 "application " | Freq: Two times a day (BID) | TRANSDERMAL | 3 refills | Status: AC

## 2017-07-29 NOTE — Progress Notes (Signed)
Curtis Sheppard - 62 y.o. male MRN [id number]  Date of birth: [birth year]  SUBJECTIVE:  Including CC & ROS.  Chief Complaint  Patient presents with  . Right knee pain    Curtis Sheppard is a 62 y.o. male that is presenting with right knee pain.  He states he was stretching last week. He exercises three days a week on the treadmill. Pain is located on the medial aspect radiates up to his thigh. Denies tingling or numbness. Denies injury or surgeries. Pain is throbbing in nature, worse with flexion. Admits to swelling and tenderness. He has been applying ice and taking Aleve for the pain. He was stretching with his leg internally rotated with his knee bent back behind him. The pain occurred two days later after stretching. He was able to walk for three miles with no pain on Wednesday. Has been icing and using NSAIDS. Pain has improved.      Review of Systems  Constitutional: Negative for fever.  HENT: Negative for congestion.   Respiratory: Negative for cough.   Cardiovascular: Negative for chest pain.  Gastrointestinal: Negative for abdominal pain.  Musculoskeletal: Positive for gait problem.  Skin: Negative for color change.  Neurological: Negative for weakness.  Hematological: Negative for adenopathy.  Psychiatric/Behavioral: Negative for agitation.    HISTORY: Past Medical, Surgical, Social, and Family History Reviewed & Updated per EMR.   Pertinent Historical Findings include:  Past Medical History:  Diagnosis Date  . Epileptic seizure ([place]) 02/21/2013   Single event.   . Hyperlipidemia   . Seizures ([place])   . UTI (urinary tract infection)     Past Surgical History:  Procedure Laterality Date  . NASAL SEPTUM SURGERY  1975   deviated    Allergies  Allergen Reactions  . Penicillins Other (See Comments)    Unknown     Family History  Problem Relation Age of Onset  . Cancer Mother        lung  . Cancer Father        lung  . Bipolar disorder Brother    paranoid manic, depressive bipolar  . Heart disease Brother 98       MI  . Stroke Paternal Grandfather   . Dementia Maternal Grandmother      Social History   Socioeconomic History  . Marital status: Married    Spouse name: tracy  . Number of children: 2  . Years of education: college  . Highest education level: Not on file  Occupational History  . Occupation: Visual merchandiser Needs  . Financial resource strain: Not on file  . Food insecurity:    Worry: Not on file    Inability: Not on file  . Transportation needs:    Medical: Not on file    Non-medical: Not on file  Tobacco Use  . Smoking status: Never Smoker  . Smokeless tobacco: Never Used  Substance and Sexual Activity  . Alcohol use: Yes  . Drug use: No  . Sexual activity: Not on file  Lifestyle  . Physical activity:    Days per week: Not on file    Minutes per session: Not on file  . Stress: Not on file  Relationships  . Social connections:    Talks on phone: Not on file    Gets together: Not on file    Attends religious service: Not on file    Active member of club or organization: Not on file    Attends meetings of clubs or  organizations: Not on file    Relationship status: Not on file  . Intimate partner violence:    Fear of current or ex partner: Not on file    Emotionally abused: Not on file    Physically abused: Not on file    Forced sexual activity: Not on file  Other Topics Concern  . Not on file  Social History Narrative  . Not on file     PHYSICAL EXAM:  VS: BP (!) 151/82 (BP Location: Left Arm, Patient Position: Sitting, Cuff Size: Normal)   Pulse 62   Temp 97.6 F (36.4 C) (Oral)   Ht 5\' 10"  (1.778 m)   Wt 194 lb (88 kg)   SpO2 97%   BMI 27.84 kg/m  Physical Exam Gen: NAD, alert, cooperative with exam, well-appearing ENT: normal lips, normal nasal mucosa,  Eye: normal EOM, normal conjunctiva and lids CV:  no edema, +2 pedal pulses   Resp: no accessory muscle  use, non-labored,  GI: no masses or tenderness, no hernia  Skin: no rashes, no areas of induration  Neuro: normal tone, normal sensation to touch Psych:  normal insight, alert and oriented MSK:  Right Knee: Normal to inspection with no erythema obvious bony abnormalities. Palpation normal with no warmth, joint line tenderness, patellar tenderness, or condyle tenderness. ROM full in flexion and extension and lower leg rotation. Ligaments with solid consistent endpoints including  LCL, MCL. Positive Thessalonian tests. Non painful patellar compression. Patellar glide without crepitus. Patellar and quadriceps tendons unremarkable. Hamstring and quadriceps strength is normal.  Pain with resistance with IR of hip and flexion  Neurovascularly intact     Limited ultrasound: Right knee:  Moderate effusion  Lateral insertion of the quad tendon appears to be thickened compared to the left side.  This would suggest a strain the distal insertion. Minimal joint space narrowing in the medial aspect.  No significant outpouching of the medial meniscus.  Summary: Knee effusion and appears to have a quad strain  Ultrasound and interpretation by Clearance Coots, MD           ASSESSMENT & PLAN:   Acute pain of right knee Appears to have a vastus lateralis quad strain and suggestions of a meniscal irritation due to the effusion.  Likely acute in nature. - counseled on HEP  - Pennsaid  - can continue OTC NSAIDS  - if no improvement consider injection and PT. Consider imaging.

## 2017-07-29 NOTE — Assessment & Plan Note (Signed)
Appears to have a vastus lateralis quad strain and suggestions of a meniscal irritation due to the effusion.  Likely acute in nature. - counseled on HEP  - Pennsaid  - can continue OTC NSAIDS  - if no improvement consider injection and PT. Consider imaging.

## 2017-07-29 NOTE — Patient Instructions (Signed)
Nice to meet you  Please try the exercises  Please try the medication  Please follow up in three weeks if the pain is ongoing.

## 2017-08-02 DIAGNOSIS — C778 Secondary and unspecified malignant neoplasm of lymph nodes of multiple regions: Secondary | ICD-10-CM | POA: Diagnosis not present

## 2017-08-02 DIAGNOSIS — R9721 Rising PSA following treatment for malignant neoplasm of prostate: Secondary | ICD-10-CM | POA: Diagnosis not present

## 2017-08-17 DIAGNOSIS — H524 Presbyopia: Secondary | ICD-10-CM | POA: Diagnosis not present

## 2017-10-08 ENCOUNTER — Other Ambulatory Visit: Payer: Self-pay | Admitting: Family Medicine

## 2017-10-08 MED ORDER — COLCHICINE 0.6 MG PO TABS: 0.6000 mg | ORAL_TABLET | Freq: Two times a day (BID) | ORAL | 3 refills | Status: DC | PRN

## 2017-10-08 NOTE — Telephone Encounter (Signed)
Copied from [place] [id number]. Topic: Quick Communication - See Telephone Encounter >> Oct 08, 2017 10:33 AM Neva Seat wrote: Pt is currently in [facility] and has a flair up of gout.  Pt is asking if he can have a refill of  colchicine 0.6 MG tablet called into his local pharmacy [id number] - [place], [place] - [id number] [place]: [phone] Fax: [phone].  [facility] [place] [facility] at their location.

## 2017-10-08 NOTE — Telephone Encounter (Signed)
Rx done. 

## 2017-10-12 DIAGNOSIS — C61 Malignant neoplasm of prostate: Secondary | ICD-10-CM | POA: Diagnosis not present

## 2017-10-13 DIAGNOSIS — Z1211 Encounter for screening for malignant neoplasm of colon: Secondary | ICD-10-CM | POA: Diagnosis not present

## 2017-10-14 DIAGNOSIS — R9721 Rising PSA following treatment for malignant neoplasm of prostate: Secondary | ICD-10-CM | POA: Diagnosis not present

## 2017-12-28 ENCOUNTER — Encounter: Payer: Self-pay | Admitting: Family Medicine

## 2017-12-28 DIAGNOSIS — D122 Benign neoplasm of ascending colon: Secondary | ICD-10-CM | POA: Diagnosis not present

## 2017-12-28 DIAGNOSIS — K5730 Diverticulosis of large intestine without perforation or abscess without bleeding: Secondary | ICD-10-CM | POA: Diagnosis not present

## 2017-12-28 DIAGNOSIS — K635 Polyp of colon: Secondary | ICD-10-CM | POA: Diagnosis not present

## 2017-12-28 DIAGNOSIS — Z1211 Encounter for screening for malignant neoplasm of colon: Secondary | ICD-10-CM | POA: Diagnosis not present

## 2017-12-28 LAB — HM COLONOSCOPY

## 2018-01-12 ENCOUNTER — Other Ambulatory Visit: Payer: Self-pay | Admitting: Family Medicine

## 2018-02-10 ENCOUNTER — Telehealth: Payer: Self-pay | Admitting: Family Medicine

## 2018-02-10 NOTE — Telephone Encounter (Signed)
Copied from [place] [id number]. Topic: Quick Communication - Rx Refill/Question >> Feb 10, 2018  1:47 PM Scherrie Gerlach wrote: Medication: triamcinolone cream (KENALOG) 0.1 %  Pt states he has itching on his arms and requesting refill of this.  Not filled since 2018 [id number] - [place], [place] - [address] [phone] (Phone) [phone] (Fax)

## 2018-02-11 ENCOUNTER — Other Ambulatory Visit: Payer: Self-pay

## 2018-02-11 MED ORDER — TRIAMCINOLONE ACETONIDE 0.1 % EX CREA: 1.0000 "application " | TOPICAL_CREAM | Freq: Two times a day (BID) | CUTANEOUS | 1 refills | Status: DC | PRN

## 2018-02-11 NOTE — Telephone Encounter (Signed)
OK to refill

## 2018-02-11 NOTE — Telephone Encounter (Signed)
Last OV 07/06/17, Next OV 03/11/2018  Last filled 05/20/16, 454 g with 1 refill  Please advise if OK to fill?

## 2018-02-11 NOTE — Telephone Encounter (Signed)
Prescription has been sent to the pharmacy. 

## 2018-02-11 NOTE — Telephone Encounter (Signed)
Pt states that he requested the med with the pharmacy on Sunday but the request was not sent to the office. He states his arms are itching terribly and would like to see if this can be sent in today if possible. Please advise.

## 2018-02-14 ENCOUNTER — Ambulatory Visit: Payer: Commercial Managed Care - PPO | Admitting: Family Medicine

## 2018-02-14 ENCOUNTER — Encounter: Payer: Self-pay | Admitting: Family Medicine

## 2018-02-14 ENCOUNTER — Other Ambulatory Visit: Payer: Self-pay

## 2018-02-14 VITALS — BP 136/84 | HR 65 | Temp 97.5°F | Ht 70.0 in | Wt 202.5 lb

## 2018-02-14 DIAGNOSIS — L239 Allergic contact dermatitis, unspecified cause: Secondary | ICD-10-CM | POA: Diagnosis not present

## 2018-02-14 MED ORDER — PREDNISONE 10 MG PO TABS: ORAL_TABLET | ORAL | 0 refills | Status: DC

## 2018-02-14 NOTE — Patient Instructions (Signed)

## 2018-02-14 NOTE — Progress Notes (Signed)
  Subjective:     Patient ID: Curtis Sheppard, male   DOB: 11/26/1955, 62 y.o.   MRN: [id number]  HPI Patient is seen with pruritic rash on his hands.  He states that last week he made some hot sauce with banana peppers and a couple other types of peppers and he thinks that may be the culprit.  He has had significant vesicles and pruritus.  No pain.  No fevers or chills.  He tried some topical triamcinolone cream without much improvement.  Symptoms are fairly severe.  Past Medical History:  Diagnosis Date  . Epileptic seizure ([place]) 02/21/2013   Single event.   . Hyperlipidemia   . Seizures ([place])   . UTI (urinary tract infection)    Past Surgical History:  Procedure Laterality Date  . NASAL SEPTUM SURGERY  1975   deviated    reports that he has never smoked. He has never used smokeless tobacco. He reports current alcohol use. He reports that he does not use drugs. family history includes Bipolar disorder in his brother; Cancer in his father and mother; Dementia in his maternal grandmother; Heart disease (age of onset: 20) in his brother; Stroke in his paternal grandfather. Allergies  Allergen Reactions  . Penicillins Other (See Comments)    Unknown      Review of Systems  Constitutional: Negative for chills and fever.  Skin: Positive for rash.       Objective:   Physical Exam Constitutional:      Appearance: Normal appearance.  Cardiovascular:     Rate and Rhythm: Normal rate and regular rhythm.  Pulmonary:     Effort: Pulmonary effort is normal.     Breath sounds: Normal breath sounds.  Skin:    Findings: Rash present.     Comments: Patient has somewhat excoriated rash involving the wrist and volar surface of both hands mostly involving the distal forearm and wrist area.  He has couple small vesicles.  No cellulitis changes  Neurological:     Mental Status: He is alert.        Assessment:     Severe contact dermatitis involving both forearms and wrist and hands    Plan:     -Prednisone taper starting at 60 mg daily and taper over 12 days. -May continue topical triamcinolone -Touch base for any persistent or worsening symptoms  Eulas Post MD [facility] at [facility]

## 2018-02-15 ENCOUNTER — Other Ambulatory Visit: Payer: Self-pay

## 2018-03-03 ENCOUNTER — Ambulatory Visit: Payer: Self-pay | Admitting: *Deleted

## 2018-03-03 ENCOUNTER — Ambulatory Visit: Payer: Self-pay

## 2018-03-03 NOTE — Telephone Encounter (Signed)
Patient called, left VM to return call back to the office and if he doesn't call back, someone will call back tomorrow.   Message from [place] sent at 03/03/2018 1:52 PM EST   Patients states he was in the office and seen Dr Elease Hashimoto on 12/16 for severe contact dermatitis involving both forearms and wrist and hands and took a round of prednisone. Patient asked to be seen tomorrow, No open appointments, patient is requesting something else to be called in if possible. Please Advise.

## 2018-03-03 NOTE — Telephone Encounter (Signed)
Pt called stating that his rash to both hands has come back with itching and swelling.  He states that he finished an prednisone RX and about 4 day later the rash is back. He states its about 25% of the original rash but the itching is bothersome.  He rates itching at 6-7. He is taking Motrin for pain and swelling which he says is effective. [facility] contacted for help with appointment. Per protocol pt will see Dorothyann Peng NP tomorrow.  Care advice read to patient. Pt verbalized understanding.  Reason for Disposition . [1] Applying cream or ointment AND [2] causes severe itch, burning or pain  Answer Assessment - Initial Assessment Questions 1. APPEARANCE of RASH: "Describe the rash."      Dry rash  2. LOCATION: "Where is the rash located?"      Both hands and wrists 3. NUMBER: "How many spots are there?"      25-30 4. SIZE: "How big are the spots?" (Inches, centimeters or compare to size of a coin)      Pin size 5. ONSET: "When did the rash start?"      4 days after taking prednisone 6. ITCHING: "Does the rash itch?" If so, ask: "How bad is the itch?"  (Scale 1-10; or mild, moderate, severe)     6-7 7. PAIN: "Does the rash hurt?" If so, ask: "How bad is the pain?"  (Scale 1-10; or mild, moderate, severe)     1-2 more tight skin from swelling 8. OTHER SYMPTOMS: "Do you have any other symptoms?" (e.g., fever)     no 9. PREGNANCY: "Is there any chance you are pregnant?" "When was your last menstrual period?"     N/A  Protocols used: RASH OR REDNESS - LOCALIZED-A-AH

## 2018-03-03 NOTE — Telephone Encounter (Signed)
Patients states he was in the office and seen Dr Elease Hashimoto on 12/16 for severe contact dermatitis involving both forearms and wrist and hands and took a round of prednisone. Patient asked to be seen tomorrow, No open appointments, patient is requesting something else to be called in if possible. Please Advise.

## 2018-03-04 ENCOUNTER — Encounter: Payer: Self-pay | Admitting: Adult Health

## 2018-03-04 ENCOUNTER — Ambulatory Visit: Payer: Commercial Managed Care - PPO | Admitting: Adult Health

## 2018-03-04 VITALS — BP 150/80 | Temp 98.3°F | Wt 206.0 lb

## 2018-03-04 DIAGNOSIS — L239 Allergic contact dermatitis, unspecified cause: Secondary | ICD-10-CM | POA: Diagnosis not present

## 2018-03-04 MED ORDER — PREDNISONE 10 MG PO TABS: ORAL_TABLET | ORAL | 0 refills | Status: DC

## 2018-03-04 MED ORDER — NYSTATIN 100000 UNIT/GM EX CREA: 1.0000 "application " | TOPICAL_CREAM | Freq: Two times a day (BID) | CUTANEOUS | 0 refills | Status: AC

## 2018-03-04 NOTE — Telephone Encounter (Signed)
Called patient and he stated that he saw [place] today and he will also see [place] next Friday for his physical.

## 2018-03-04 NOTE — Telephone Encounter (Signed)
Did he improve with the prednisone?  May refill once- if needed.

## 2018-03-04 NOTE — Telephone Encounter (Signed)
Please sere message. Please advise.

## 2018-03-04 NOTE — Progress Notes (Signed)
Subjective:    Patient ID: Curtis Sheppard, male    DOB: 01-Mar-1956, 63 y.o.   MRN: [id number]  HPI  63 year old male who  has a past medical history of Epileptic seizure ([place]) (02/21/2013), Hyperlipidemia, Seizures ([place]), and UTI (urinary tract infection).  He presents to the office today for follow up after being seen by his PCP approx 2 weeks ago for pruritic rash on hands & forearms  He was started on a prednisone taper starting at 60 mg daily and tapered over 12 days.  Today he reports that his rash completely resolved but about 4 days after prednisone taper was completed rash reappeared.  Feels as though currently is about 75% less severe than it was originally.  Continues to complain of pruritus.  Review of Systems See HPI   Past Medical History:  Diagnosis Date  . Epileptic seizure ([place]) 02/21/2013   Single event.   . Hyperlipidemia   . Seizures ([place])   . UTI (urinary tract infection)     Social History   Socioeconomic History  . Marital status: Married    Spouse name: tracy  . Number of children: 2  . Years of education: college  . Highest education level: Not on file  Occupational History  . Occupation: Visual merchandiser Needs  . Financial resource strain: Not on file  . Food insecurity:    Worry: Not on file    Inability: Not on file  . Transportation needs:    Medical: Not on file    Non-medical: Not on file  Tobacco Use  . Smoking status: Never Smoker  . Smokeless tobacco: Never Used  Substance and Sexual Activity  . Alcohol use: Yes  . Drug use: No  . Sexual activity: Not on file  Lifestyle  . Physical activity:    Days per week: Not on file    Minutes per session: Not on file  . Stress: Not on file  Relationships  . Social connections:    Talks on phone: Not on file    Gets together: Not on file    Attends religious service: Not on file    Active member of club or organization: Not on file    Attends meetings of clubs or  organizations: Not on file    Relationship status: Not on file  . Intimate partner violence:    Fear of current or ex partner: Not on file    Emotionally abused: Not on file    Physically abused: Not on file    Forced sexual activity: Not on file  Other Topics Concern  . Not on file  Social History Narrative  . Not on file    Past Surgical History:  Procedure Laterality Date  . NASAL SEPTUM SURGERY  1975   deviated    Family History  Problem Relation Age of Onset  . Cancer Mother        lung  . Cancer Father        lung  . Bipolar disorder Brother        paranoid manic, depressive bipolar  . Heart disease Brother 32       MI  . Stroke Paternal Grandfather   . Dementia Maternal Grandmother     Allergies  Allergen Reactions  . Penicillins Other (See Comments)    Unknown     Current Outpatient Medications on File Prior to Visit  Medication Sig Dispense Refill  . colchicine 0.6 MG tablet Take 1 tablet (  0.6 mg total) by mouth 2 (two) times daily as needed. For gout. 60 tablet 3  . diazepam (VALIUM) 2 MG tablet Use at bedtime for insomnia and bladder relaxation. 30 tablet 3  . Diclofenac Sodium (PENNSAID) 2 % SOLN Place 1 application onto the skin 2 (two) times daily. 1 Bottle 3  . Omega-3 Fatty Acids (FISH OIL) 1200 MG CAPS Take 2 capsules by mouth 2 (two) times daily.     . sildenafil (VIAGRA) 100 MG tablet Take 0.5-1 tablets (50-100 mg total) by mouth daily as needed for erectile dysfunction. 10 tablet 11  . simvastatin (ZOCOR) 20 MG tablet Take 1 tablet (20 mg total) by mouth at bedtime. 90 tablet 3  . tamsulosin (FLOMAX) 0.4 MG CAPS capsule TAKE 1 CAPSULE BY MOUTH EVERY DAY 30 capsule 5  . triamcinolone (NASACORT) 55 MCG/ACT AERO nasal inhaler Place 2 sprays into the nose at bedtime as needed. 1 Inhaler 12  . triamcinolone cream (KENALOG) 0.1 % Apply 1 application topically 2 (two) times daily as needed. Compound 1:1 with Eucerin and apply to affected skin bid prn 454  g 1  . TURMERIC PO Take 1,000 mg by mouth.     No current facility-administered medications on file prior to visit.     BP (!) 150/80   Temp 98.3 F (36.8 C)   Wt 206 lb (93.4 kg)   BMI 29.56 kg/m       Objective:   Physical Exam Vitals signs and nursing note reviewed.  Constitutional:      Appearance: Normal appearance.  Skin:    General: Skin is warm and dry.     Findings: Rash present.     Comments: Red scattered papular-like rash on bilateral hands and forearms No tracking noted.  No signs of infection  Neurological:     Mental Status: He is alert.       Assessment & Plan:  1. Allergic contact dermatitis, unspecified trigger -Known cause.  No change in detergents or soaps.  Possible rebound dermatitis.  Will place back on prednisone taper.  Also prescribed nystatin cream to cover for any fungal activity and he has a follow-up with his PCP in 1 week - predniSONE (DELTASONE) 10 MG tablet; Take two tabs for 6 days and then 1 tab for 6 days  Dispense: 18 tablet; Refill: 0 - nystatin cream (MYCOSTATIN); Apply 1 application topically 2 (two) times daily.  Dispense: 30 g; Refill: 0  Dorothyann Peng, NP

## 2018-03-08 ENCOUNTER — Other Ambulatory Visit: Payer: Self-pay | Admitting: Family Medicine

## 2018-03-11 ENCOUNTER — Ambulatory Visit (INDEPENDENT_AMBULATORY_CARE_PROVIDER_SITE_OTHER): Payer: Commercial Managed Care - PPO | Admitting: Family Medicine

## 2018-03-11 ENCOUNTER — Encounter: Payer: Self-pay | Admitting: Family Medicine

## 2018-03-11 ENCOUNTER — Other Ambulatory Visit: Payer: Self-pay

## 2018-03-11 VITALS — BP 132/70 | HR 64 | Temp 97.7°F | Ht 67.5 in | Wt 198.7 lb

## 2018-03-11 DIAGNOSIS — L918 Other hypertrophic disorders of the skin: Secondary | ICD-10-CM

## 2018-03-11 DIAGNOSIS — Z0000 Encounter for general adult medical examination without abnormal findings: Secondary | ICD-10-CM

## 2018-03-11 DIAGNOSIS — Z0001 Encounter for general adult medical examination with abnormal findings: Secondary | ICD-10-CM | POA: Diagnosis not present

## 2018-03-11 DIAGNOSIS — Z125 Encounter for screening for malignant neoplasm of prostate: Secondary | ICD-10-CM | POA: Diagnosis not present

## 2018-03-11 LAB — LIPID PANEL
Cholesterol: 169 mg/dL (ref 0–200)
HDL: 63.7 mg/dL (ref >39.00)
LDL Cholesterol: 87 mg/dL (ref 0–99)
NonHDL: 105.1
Total CHOL/HDL Ratio: 3
Triglycerides: 92 mg/dL (ref 0.0–149.0)
VLDL: 18.4 mg/dL (ref 0.0–40.0)

## 2018-03-11 LAB — BASIC METABOLIC PANEL
BUN: 17 mg/dL (ref 6–23)
CO2: 29 mEq/L (ref 19–32)
Calcium: 9.8 mg/dL (ref 8.4–10.5)
Chloride: 105 mEq/L (ref 96–112)
Creatinine, Ser: 1.18 mg/dL (ref 0.40–1.50)
GFR: 66 mL/min (ref >60.00)
Glucose, Bld: 105 mg/dL — ABNORMAL HIGH (ref 70–99)
Potassium: 4.9 mEq/L (ref 3.5–5.1)
Sodium: 142 mEq/L (ref 135–145)

## 2018-03-11 LAB — HEPATIC FUNCTION PANEL
ALT: 45 U/L (ref 0–53)
AST: 46 U/L — ABNORMAL HIGH (ref 0–37)
Albumin: 4.8 g/dL (ref 3.5–5.2)
Alkaline Phosphatase: 44 U/L (ref 39–117)
Bilirubin, Direct: 0.2 mg/dL (ref 0.0–0.3)
Total Bilirubin: 1.1 mg/dL (ref 0.2–1.2)
Total Protein: 7 g/dL (ref 6.0–8.3)

## 2018-03-11 LAB — CBC WITH DIFFERENTIAL/PLATELET
Basophils Absolute: 0 10*3/uL (ref 0.0–0.1)
Basophils Relative: 0.7 % (ref 0.0–3.0)
Eosinophils Absolute: 0.1 10*3/uL (ref 0.0–0.7)
Eosinophils Relative: 1.6 % (ref 0.0–5.0)
HCT: 44 % (ref 39.0–52.0)
Hemoglobin: 15.2 g/dL (ref 13.0–17.0)
Lymphocytes Relative: 23.3 % (ref 12.0–46.0)
Lymphs Abs: 1.6 10*3/uL (ref 0.7–4.0)
MCHC: 34.7 g/dL (ref 30.0–36.0)
MCV: 93.8 fl (ref 78.0–100.0)
Monocytes Absolute: 0.6 10*3/uL (ref 0.1–1.0)
Monocytes Relative: 8.3 % (ref 3.0–12.0)
Neutro Abs: 4.6 10*3/uL (ref 1.4–7.7)
Neutrophils Relative %: 66.1 % (ref 43.0–77.0)
Platelets: 195 10*3/uL (ref 150.0–400.0)
RBC: 4.69 Mil/uL (ref 4.22–5.81)
RDW: 13.6 % (ref 11.5–15.5)
WBC: 7 10*3/uL (ref 4.0–10.5)

## 2018-03-11 LAB — PSA: PSA: 17.33 ng/mL — ABNORMAL HIGH (ref 0.10–4.00)

## 2018-03-11 LAB — TSH: TSH: 0.95 u[IU]/mL (ref 0.35–4.50)

## 2018-03-11 MED ORDER — DIAZEPAM 2 MG PO TABS: ORAL_TABLET | ORAL | 2 refills | Status: DC

## 2018-03-11 MED ORDER — ZOLPIDEM TARTRATE 10 MG PO TABS: 10.0000 mg | ORAL_TABLET | Freq: Every evening | ORAL | 1 refills | Status: DC | PRN

## 2018-03-11 NOTE — Progress Notes (Signed)
Subjective:     Patient ID: Curtis Sheppard, male   DOB: 1955/09/28, 63 y.o.   MRN: [id number]  HPI Patient is seen for physical exam.  His chronic problems include history of some insomnia, prostate cancer, gout, history of seizures, dyslipidemia.  He was placed on low-dose diazepam 2 mg 1/2 tablet at night for bladder spasm issues and urine urgency and he states that works great.  He tries not to take this nightly.  Health maintenance reviewed.  Colonoscopy up-to-date.  He had Zostavax couple years ago.  Declines shingles vaccine.  Will turn 63 next year.  Flu vaccine already given.  Tetanus up-to-date.  Currently exercising with walking about 4 miles several days per week combination of treadmill and outdoors  Patient has several bothersome skin tags around the eyes.  He is requesting treatment.  Past Medical History:  Diagnosis Date  . Epileptic seizure ([place]) 02/21/2013   Single event.   . Hyperlipidemia   . Seizures ([place])   . UTI (urinary tract infection)    Past Surgical History:  Procedure Laterality Date  . NASAL SEPTUM SURGERY  1975   deviated    reports that he has never smoked. He has never used smokeless tobacco. He reports current alcohol use. He reports that he does not use drugs. family history includes Bipolar disorder in his brother; Cancer in his father and mother; Dementia in his maternal grandmother; Heart disease (age of onset: 65) in his brother; Stroke in his paternal grandfather. Allergies  Allergen Reactions  . Penicillins Other (See Comments)    Unknown      Review of Systems  Constitutional: Negative for activity change, appetite change, fatigue and fever.  HENT: Negative for congestion, ear pain and trouble swallowing.   Eyes: Negative for pain and visual disturbance.  Respiratory: Negative for cough, shortness of breath and wheezing.   Cardiovascular: Negative for chest pain and palpitations.  Gastrointestinal: Negative for abdominal distention,  abdominal pain, blood in stool, constipation, diarrhea, nausea, rectal pain and vomiting.  Endocrine: Negative for polydipsia and polyuria.  Genitourinary: Negative for dysuria, hematuria and testicular pain.  Musculoskeletal: Negative for arthralgias and joint swelling.  Skin: Negative for rash.  Neurological: Negative for dizziness, syncope and headaches.  Hematological: Negative for adenopathy.  Psychiatric/Behavioral: Negative for confusion and dysphoric mood.       Objective:   Physical Exam Constitutional:      General: He is not in acute distress.    Appearance: He is well-developed.  HENT:     Head: Normocephalic and atraumatic.     Right Ear: External ear normal.     Left Ear: External ear normal.  Eyes:     Conjunctiva/sclera: Conjunctivae normal.     Pupils: Pupils are equal, round, and reactive to light.  Neck:     Musculoskeletal: Normal range of motion and neck supple.     Thyroid: No thyromegaly.  Cardiovascular:     Rate and Rhythm: Normal rate and regular rhythm.     Heart sounds: Normal heart sounds. No murmur.  Pulmonary:     Effort: No respiratory distress.     Breath sounds: No wheezing or rales.  Abdominal:     General: Bowel sounds are normal. There is no distension.     Palpations: Abdomen is soft. There is no mass.     Tenderness: There is no abdominal tenderness. There is no guarding or rebound.  Lymphadenopathy:     Cervical: No cervical adenopathy.  Skin:  Findings: No rash.     Comments: He has several small benign-appearing skin tags around both eyes  Neurological:     Mental Status: He is alert and oriented to person, place, and time.     Cranial Nerves: No cranial nerve deficit.     Deep Tendon Reflexes: Reflexes normal.        Assessment:     Physical exam.  The following issues were addressed today    Plan:     -Obtain follow-up labs including PSA with his past history of prostate cancer.  This is currently being followed  medically. -Discussed Shingrix vaccine at this point he declines -Continue with yearly flu vaccine -Refill diazepam to take low-dose 1 mg nightly as needed.  Try to avoid regular use. -We will need Prevnar 13 by next year at age 69 -We discussed risk and benefits of treatment with liquid nitrogen to skin tags including risk of blistering, and infection and patient consented.  We used  liquid nitrogen and carefully protected eyes and were able to freeze 4 different skin tags - two around the right eye and two left and he tolerated well  Eulas Post MD [facility]

## 2018-03-21 DIAGNOSIS — R9721 Rising PSA following treatment for malignant neoplasm of prostate: Secondary | ICD-10-CM | POA: Diagnosis not present

## 2018-03-21 DIAGNOSIS — C61 Malignant neoplasm of prostate: Secondary | ICD-10-CM | POA: Diagnosis not present

## 2018-04-04 ENCOUNTER — Other Ambulatory Visit: Payer: Self-pay | Admitting: Family Medicine

## 2018-04-04 DIAGNOSIS — Z5111 Encounter for antineoplastic chemotherapy: Secondary | ICD-10-CM | POA: Diagnosis not present

## 2018-04-04 DIAGNOSIS — C61 Malignant neoplasm of prostate: Secondary | ICD-10-CM | POA: Diagnosis not present

## 2018-05-06 ENCOUNTER — Ambulatory Visit: Payer: Commercial Managed Care - PPO | Admitting: Family Medicine

## 2018-05-06 ENCOUNTER — Other Ambulatory Visit: Payer: Self-pay

## 2018-05-06 ENCOUNTER — Encounter: Payer: Self-pay | Admitting: Family Medicine

## 2018-05-06 VITALS — BP 132/72 | HR 62 | Temp 97.8°F | Ht 67.5 in | Wt 205.2 lb

## 2018-05-06 DIAGNOSIS — J320 Chronic maxillary sinusitis: Secondary | ICD-10-CM

## 2018-05-06 MED ORDER — DOXYCYCLINE HYCLATE 100 MG PO CAPS: 100.0000 mg | ORAL_CAPSULE | Freq: Two times a day (BID) | ORAL | 0 refills | Status: DC

## 2018-05-06 NOTE — Progress Notes (Signed)
  Subjective:     Patient ID: Curtis Sheppard, male   DOB: 08/12/1955, 63 y.o.   MRN: [id number]  HPI Patient is seen with concern for possible acute sinusitis.  He has had some increased night sweats mostly involving the head but he just started Lupron and he thinks the sweats may be related to that.  He has had about 1 week history of some intermittent headaches and right frontal and maxillary sinus pressure.  He has been hydrating very well.  Denies any body aches.  No fever.  Rare dry cough.  No upper teeth pain  Past Medical History:  Diagnosis Date  . Epileptic seizure ([place]) 02/21/2013   Single event.   . Hyperlipidemia   . Seizures ([place])   . UTI (urinary tract infection)    Past Surgical History:  Procedure Laterality Date  . NASAL SEPTUM SURGERY  1975   deviated    reports that he has never smoked. He has never used smokeless tobacco. He reports current alcohol use. He reports that he does not use drugs. family history includes Bipolar disorder in his brother; Cancer in his father and mother; Dementia in his maternal grandmother; Heart disease (age of onset: 44) in his brother; Stroke in his paternal grandfather. Allergies  Allergen Reactions  . Penicillins Other (See Comments)    Unknown      Review of Systems  Constitutional: Negative for fever.  HENT: Positive for congestion, sinus pressure and sinus pain.   Respiratory: Negative for cough and shortness of breath.   Cardiovascular: Negative for chest pain.       Objective:   Physical Exam Constitutional:      Appearance: Normal appearance. He is not ill-appearing.  HENT:     Right Ear: Tympanic membrane normal.     Left Ear: Tympanic membrane normal.     Nose:     Comments: Erythematous nasal mucosa with some yellow crusted drainage Neck:     Musculoskeletal: Neck supple.  Cardiovascular:     Rate and Rhythm: Normal rate and regular rhythm.  Pulmonary:     Effort: Pulmonary effort is normal.     Breath  sounds: Normal breath sounds.  Neurological:     Mental Status: He is alert.        Assessment:     Probable acute right maxillary and frontal sinusitis    Plan:     -Continue conservative measures with good hydration humidifier, nasal saline, Mucinex -We wrote prescription for doxycycline 100 mg twice daily for 10 days if he has any persistent or worsening symptoms but he will try to give this a few more days to see if this resolves on its own  Eulas Post MD [facility]

## 2018-05-06 NOTE — Patient Instructions (Signed)
Sinusitis, Adult  Sinusitis is inflammation of your sinuses. Sinuses are hollow spaces in the bones around your face. Your sinuses are located:   Around your eyes.   In the middle of your forehead.   Behind your nose.   In your cheekbones.  Mucus normally drains out of your sinuses. When your nasal tissues become inflamed or swollen, mucus can become trapped or blocked. This allows bacteria, viruses, and fungi to grow, which leads to infection. Most infections of the sinuses are caused by a virus.  Sinusitis can develop quickly. It can last for up to 4 weeks (acute) or for more than 12 weeks (chronic). Sinusitis often develops after a cold.  What are the causes?  This condition is caused by anything that creates swelling in the sinuses or stops mucus from draining. This includes:   Allergies.   Asthma.   Infection from bacteria or viruses.   Deformities or blockages in your nose or sinuses.   Abnormal growths in the nose (nasal polyps).   Pollutants, such as chemicals or irritants in the air.   Infection from fungi (rare).  What increases the risk?  You are more likely to develop this condition if you:   Have a weak body defense system (immune system).   Do a lot of swimming or diving.   Overuse nasal sprays.   Smoke.  What are the signs or symptoms?  The main symptoms of this condition are pain and a feeling of pressure around the affected sinuses. Other symptoms include:   Stuffy nose or congestion.   Thick drainage from your nose.   Swelling and warmth over the affected sinuses.   Headache.   Upper toothache.   A cough that may get worse at night.   Extra mucus that collects in the throat or the back of the nose (postnasal drip).   Decreased sense of smell and taste.   Fatigue.   A fever.   Sore throat.   Bad breath.  How is this diagnosed?  This condition is diagnosed based on:   Your symptoms.   Your medical history.   A physical exam.   Tests to find out if your condition is  acute or chronic. This may include:  ? Checking your nose for nasal polyps.  ? Viewing your sinuses using a device that has a light (endoscope).  ? Testing for allergies or bacteria.  ? Imaging tests, such as an MRI or CT scan.  In rare cases, a bone biopsy may be done to rule out more serious types of fungal sinus disease.  How is this treated?  Treatment for sinusitis depends on the cause and whether your condition is chronic or acute.   If caused by a virus, your symptoms should go away on their own within 10 days. You may be given medicines to relieve symptoms. They include:  ? Medicines that shrink swollen nasal passages (topical intranasal decongestants).  ? Medicines that treat allergies (antihistamines).  ? A spray that eases inflammation of the nostrils (topical intranasal corticosteroids).  ? Rinses that help get rid of thick mucus in your nose (nasal saline washes).   If caused by bacteria, your health care provider may recommend waiting to see if your symptoms improve. Most bacterial infections will get better without antibiotic medicine. You may be given antibiotics if you have:  ? A severe infection.  ? A weak immune system.   If caused by narrow nasal passages or nasal polyps, you may need   to have surgery.  Follow these instructions at home:  Medicines   Take, use, or apply over-the-counter and prescription medicines only as told by your health care provider. These may include nasal sprays.   If you were prescribed an antibiotic medicine, take it as told by your health care provider. Do not stop taking the antibiotic even if you start to feel better.  Hydrate and humidify     Drink enough fluid to keep your urine pale yellow. Staying hydrated will help to thin your mucus.   Use a cool mist humidifier to keep the humidity level in your home above 50%.   Inhale steam for 10-15 minutes, 3-4 times a day, or as told by your health care provider. You can do this in the bathroom while a hot shower is  running.   Limit your exposure to cool or dry air.  Rest   Rest as much as possible.   Sleep with your head raised (elevated).   Make sure you get enough sleep each night.  General instructions     Apply a warm, moist washcloth to your face 3-4 times a day or as told by your health care provider. This will help with discomfort.   Wash your hands often with soap and water to reduce your exposure to germs. If soap and water are not available, use hand sanitizer.   Do not smoke. Avoid being around people who are smoking (secondhand smoke).   Keep all follow-up visits as told by your health care provider. This is important.  Contact a health care provider if:   You have a fever.   Your symptoms get worse.   Your symptoms do not improve within 10 days.  Get help right away if:   You have a severe headache.   You have persistent vomiting.   You have severe pain or swelling around your face or eyes.   You have vision problems.   You develop confusion.   Your neck is stiff.   You have trouble breathing.  Summary   Sinusitis is soreness and inflammation of your sinuses. Sinuses are hollow spaces in the bones around your face.   This condition is caused by nasal tissues that become inflamed or swollen. The swelling traps or blocks the flow of mucus. This allows bacteria, viruses, and fungi to grow, which leads to infection.   If you were prescribed an antibiotic medicine, take it as told by your health care provider. Do not stop taking the antibiotic even if you start to feel better.   Keep all follow-up visits as told by your health care provider. This is important.  This information is not intended to replace advice given to you by your health care provider. Make sure you discuss any questions you have with your health care provider.  Document Released: 02/16/2005 Document Revised: 07/19/2017 Document Reviewed: 07/19/2017  Elsevier Interactive Patient Education  2019 [employer]

## 2018-06-01 ENCOUNTER — Other Ambulatory Visit: Payer: Self-pay

## 2018-06-01 ENCOUNTER — Encounter: Payer: Self-pay | Admitting: Family Medicine

## 2018-06-01 ENCOUNTER — Ambulatory Visit (INDEPENDENT_AMBULATORY_CARE_PROVIDER_SITE_OTHER): Payer: Commercial Managed Care - PPO | Admitting: Family Medicine

## 2018-06-01 DIAGNOSIS — L304 Erythema intertrigo: Secondary | ICD-10-CM

## 2018-06-01 MED ORDER — FLUCONAZOLE 100 MG PO TABS: 100.0000 mg | ORAL_TABLET | Freq: Every day | ORAL | 0 refills | Status: AC

## 2018-06-01 NOTE — Progress Notes (Signed)
Patient ID: Curtis Sheppard, male   DOB: December 26, 1955, 63 y.o.   MRN: [id number]  Virtual Visit via Video Note  I connected with Barbie Banner on 06/01/18 at  1:15 PM EDT by a video enabled telemedicine application and verified that I am speaking with the correct person using two identifiers.  Location patient: home Location provider:work or home office Persons participating in the virtual visit: patient, provider  I discussed the limitations of evaluation and management by telemedicine and the availability of in person appointments. The patient expressed understanding and agreed to proceed.   HPI: Patient relates onset about 3 days ago of rash in his groin region bilaterally.  He noticed some burning and odor.  He had some leftover nystatin cream which he tried without improvement.  Also used some hydrogen peroxide.  His wife had some lidocaine jelly which seemed to help the burning.  Patient was prescribed recent doxycycline but never took.  No history of diabetes.  He has had prediabetes range blood sugars.  He has history of prostate cancer and is getting antiestrogen therapy which is contributing some hot flashes and sweating.  He thinks this may be contributing.  No history of recurrent Candida   ROS: See pertinent positives and negatives per HPI.  Past Medical History:  Diagnosis Date  . Epileptic seizure ([place]) 02/21/2013   Single event.   . Hyperlipidemia   . Seizures ([place])   . UTI (urinary tract infection)     Past Surgical History:  Procedure Laterality Date  . NASAL SEPTUM SURGERY  1975   deviated    Family History  Problem Relation Age of Onset  . Cancer Mother        lung  . Cancer Father        lung  . Bipolar disorder Brother        paranoid manic, depressive bipolar  . Heart disease Brother 37       MI  . Stroke Paternal Grandfather   . Dementia Maternal Grandmother     SOCIAL HX: Non-smoker   Current Outpatient Medications:  .  colchicine 0.6 MG  tablet, Take 1 tablet (0.6 mg total) by mouth 2 (two) times daily as needed. For gout., Disp: 60 tablet, Rfl: 3 .  diazepam (VALIUM) 2 MG tablet, Use at bedtime for insomnia and bladder relaxation., Disp: 30 tablet, Rfl: 2 .  Diclofenac Sodium (PENNSAID) 2 % SOLN, Place 1 application onto the skin 2 (two) times daily., Disp: 1 Bottle, Rfl: 3 .  nystatin cream (MYCOSTATIN), Apply 1 application topically 2 (two) times daily., Disp: 30 g, Rfl: 0 .  Omega-3 Fatty Acids (FISH OIL) 1200 MG CAPS, Take 2 capsules by mouth 2 (two) times daily. , Disp: , Rfl:  .  sildenafil (VIAGRA) 100 MG tablet, Take 0.5-1 tablets (50-100 mg total) by mouth daily as needed for erectile dysfunction., Disp: 10 tablet, Rfl: 11 .  simvastatin (ZOCOR) 20 MG tablet, TAKE 1 TABLET BY MOUTH EVERYDAY AT BEDTIME, Disp: 30 tablet, Rfl: 11 .  tamsulosin (FLOMAX) 0.4 MG CAPS capsule, TAKE 1 CAPSULE BY MOUTH EVERY DAY, Disp: 30 capsule, Rfl: 5 .  triamcinolone (NASACORT) 55 MCG/ACT AERO nasal inhaler, Place 2 sprays into the nose at bedtime as needed., Disp: 1 Inhaler, Rfl: 12 .  triamcinolone cream (KENALOG) 0.1 %, Apply 1 application topically 2 (two) times daily as needed. Compound 1:1 with Eucerin and apply to affected skin bid prn, Disp: 454 g, Rfl: 1 .  TURMERIC PO, Take 1,000  mg by mouth., Disp: , Rfl:  .  zolpidem (AMBIEN) 10 MG tablet, Take 1 tablet (10 mg total) by mouth at bedtime as needed for up to 30 days for sleep., Disp: 30 tablet, Rfl: 1  EXAM:  VITALS per patient if applicable:  GENERAL: alert, oriented, appears well and in no acute distress  HEENT: atraumatic, conjunttiva clear, no obvious abnormalities on inspection of external nose and ears  NECK: normal movements of the head and neck  LUNGS: on inspection no signs of respiratory distress, breathing rate appears normal, no obvious gross SOB, gasping or wheezing  CV: no obvious cyanosis  MS: moves all visible extremities without noticeable  abnormality  PSYCH/NEURO: pleasant and cooperative, no obvious depression or anxiety, speech and thought processing grossly intact  ASSESSMENT AND PLAN:  Discussed the following assessment and plan:  Intertrigo  -Keep area dry as possible -Consider over-the-counter zinc oxide to repel moisture -Diflucan 100 mg once daily for 7 days -Doubt Trichophyton species but consider change of topical antifungal if not improving over the next week.  We also explained these can be miss infections with mixed fungal, bacterial, and yeast components     I discussed the assessment and treatment plan with the patient. The patient was provided an opportunity to ask questions and all were answered. The patient agreed with the plan and demonstrated an understanding of the instructions.   The patient was advised to call back or seek an in-person evaluation if the symptoms worsen or if the condition fails to improve as anticipated.  Carolann Littler, MD

## 2018-11-27 ENCOUNTER — Other Ambulatory Visit: Payer: Self-pay | Admitting: Family Medicine

## 2018-11-29 NOTE — Telephone Encounter (Signed)
Dr. Elease Hashimoto please advise in regards to Ambien. If okay please send rx refill electronically . Thanks

## 2019-04-01 ENCOUNTER — Ambulatory Visit: Payer: Commercial Managed Care - PPO

## 2019-04-10 DIAGNOSIS — C61 Malignant neoplasm of prostate: Secondary | ICD-10-CM | POA: Diagnosis not present

## 2019-04-12 ENCOUNTER — Other Ambulatory Visit: Payer: Self-pay | Admitting: Family Medicine

## 2019-04-12 ENCOUNTER — Ambulatory Visit: Payer: Commercial Managed Care - PPO

## 2019-04-12 IMAGING — CT NM PET NOPR SKULL BASE TO THIGH
1 of 8 series · 1 of 25 positions shown · non-contrast
Comparison: None.

CLINICAL DATA: Prostate carcinoma with biochemical recurrence.
CyberKnife therapy 2 years prior. Elevated PSA equal 5.5 on
03/10/2017.

EXAM:
NUCLEAR MEDICINE PET SKULL BASE TO THIGH
TECHNIQUE: 9.7 mCi F-18 Fluciclovine was injected intravenously. Full-ring PET
imaging was performed from the skull base to thigh after the
radiotracer. CT data was obtained and used for attenuation
correction and anatomic localization.

[Series 4: ct sk_thigh 5.0 b31f · axial · 5.0mm · 0.98mm/px · 1 of 244 slices shown]
[im 244/244  brain]
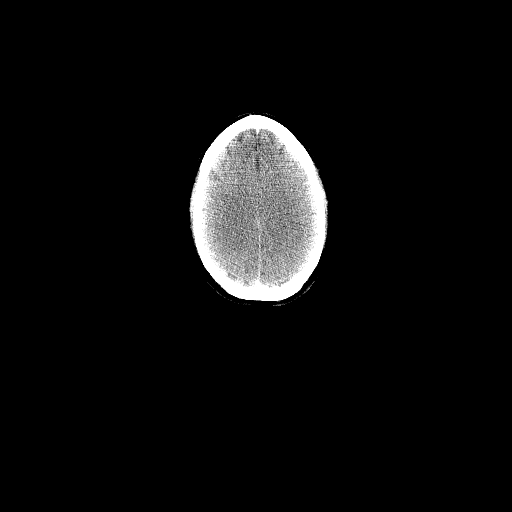

[1 of 25 positions shown; findings below may reference images not displayed]

FINDINGS: NECK

No radiotracer activity in neck lymph nodes.

CHEST

No radiotracer accumulation within mediastinal or hilar lymph nodes.
No suspicious pulmonary nodules on the CT scan.

ABDOMEN/PELVIS

Diffuse low level radiotracer activity within the prostate gland.
More focal activity in the LEFT aspect with SUV max equal 3.9.

Lymph node in the deep LEFT pelvis along the internal iliac chain
measures 11 mm (image 27, series 4) has intense radiotracer
accumulation with SUV max equal 8.2. Smaller adjacent 5 mm node also
has radiotracer activity.

There is a lymph node in the ventral peritoneal space adjacent the
LEFT aspect the bladder and slightly medial to the external iliac
vessels on LEFT. This lymph node measures 7 mm (image 195, series 4)
and has intense radiotracer activity SUV max equal 8.3.

No the evidence of abnormal radiotracer accumulation within lymph
nodes outside the pelvis. No enlarged periaortic lymph nodes.

No abnormal uptake within the liver.

SKELETON

No focal  activity to suggest skeletal metastasis.
IMPRESSION: 1. Evidence of local nodal recurrence with intense radiotracer
activity within a deep LEFT internal iliac lymph node and at
anterior LEFT external iliac lymph node adjacent to the bladder.
2. Potential local recurrence in the LEFT aspect the prostate gland
is indeterminate.
3. No evidence of metastatic disease outside the pelvis.

## 2019-04-18 DIAGNOSIS — C61 Malignant neoplasm of prostate: Secondary | ICD-10-CM | POA: Diagnosis not present

## 2019-04-18 DIAGNOSIS — R351 Nocturia: Secondary | ICD-10-CM | POA: Diagnosis not present

## 2019-04-29 IMAGING — CT CT HEART SCORING
2 series · 16 of 20 positions shown, 19 images · non-contrast
Comparison: 04/06/2017 PET

CLINICAL DATA: Risk stratification

EXAM:
Coronary Calcium Score
TECHNIQUE: The patient was scanned on a Siemens Somatom 64 slice scanner. Axial
non-contrast 3 mm slices were carried out through the heart. The
data set was analyzed on a dedicated work station and scored using
the Agatson method.

[Series 3: casc 3.0 i36f 2 bestdiast 72 % · axial · 0.38mm/px · z∈[-185,-86]mm · 9 of 43 slices shown, 12 images]
[im 5/43  vessel]
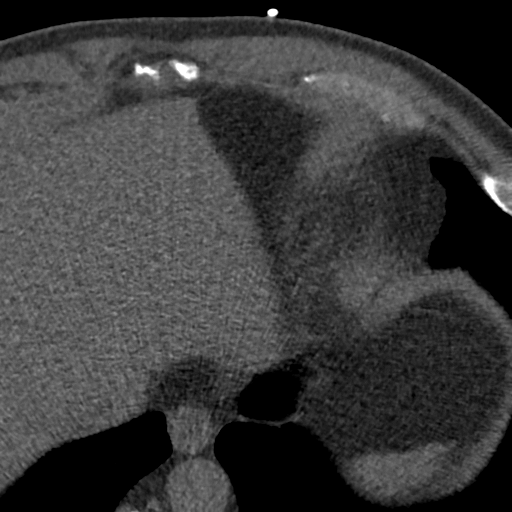
[im 5/43  lung]
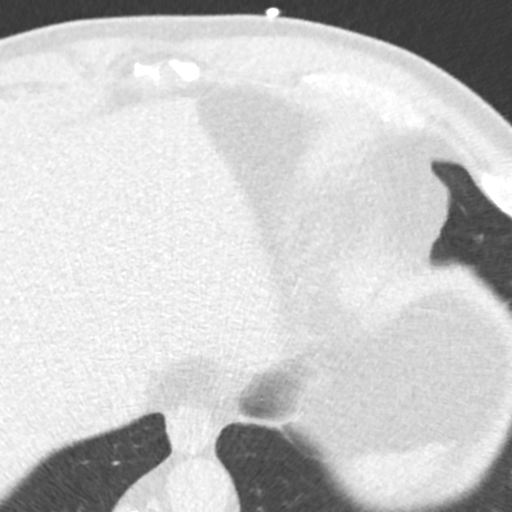
[im 9/43  vessel]
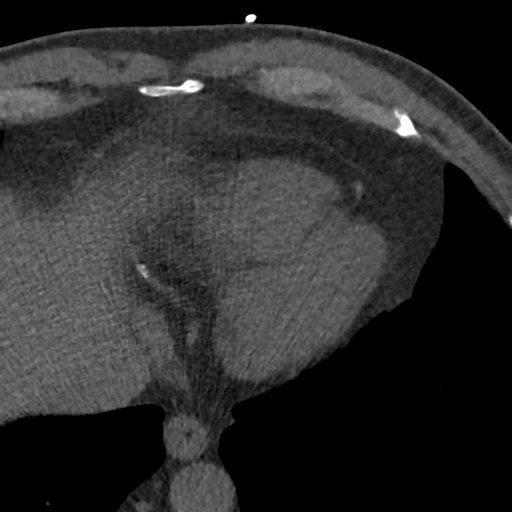
[im 13/43  vessel]
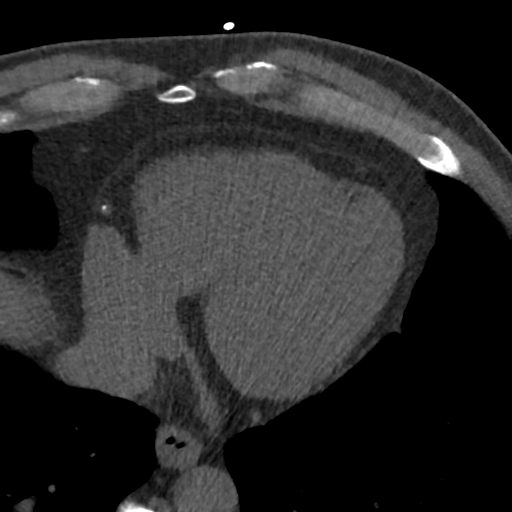
[im 17/43  vessel]
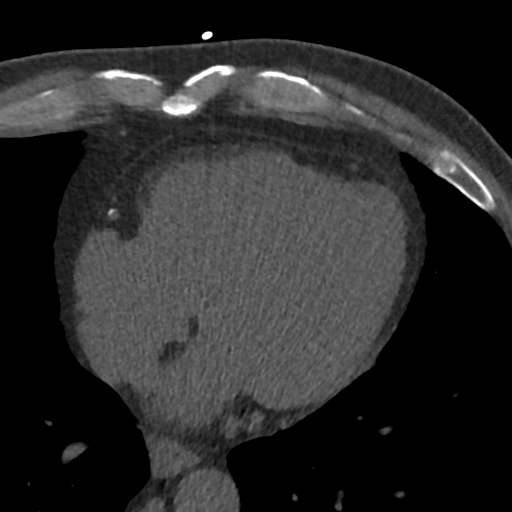
[im 22/43  vessel]
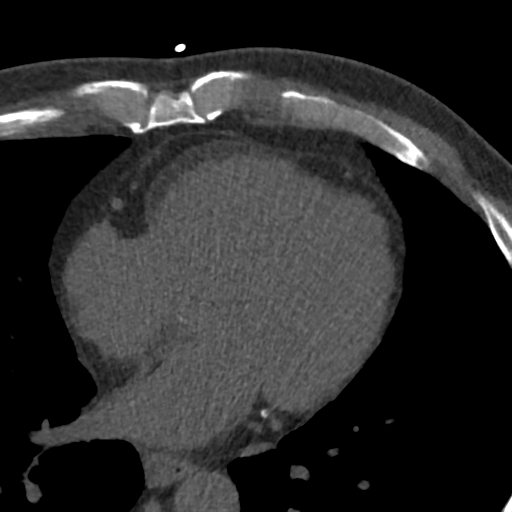
[im 22/43  lung]
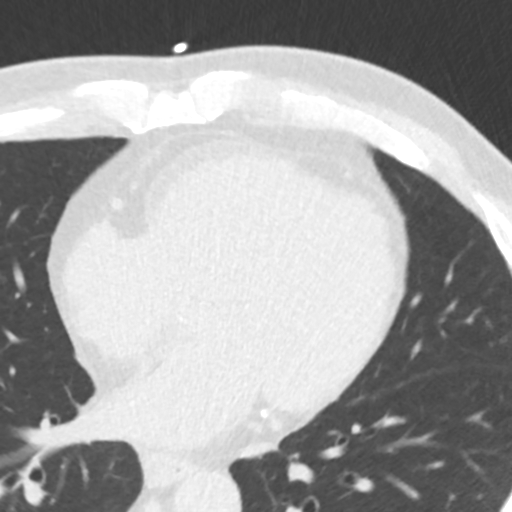
[im 26/43  vessel]
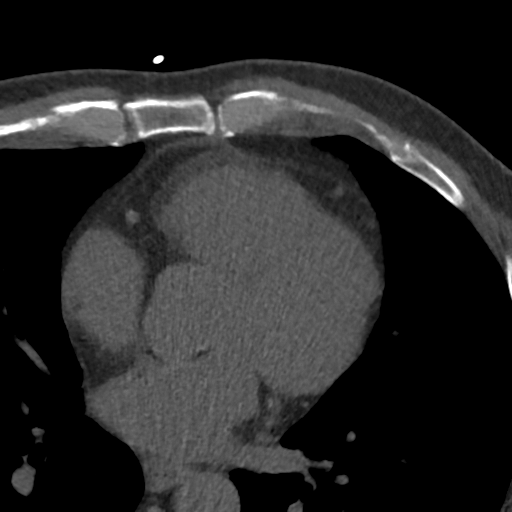
[im 30/43  vessel]
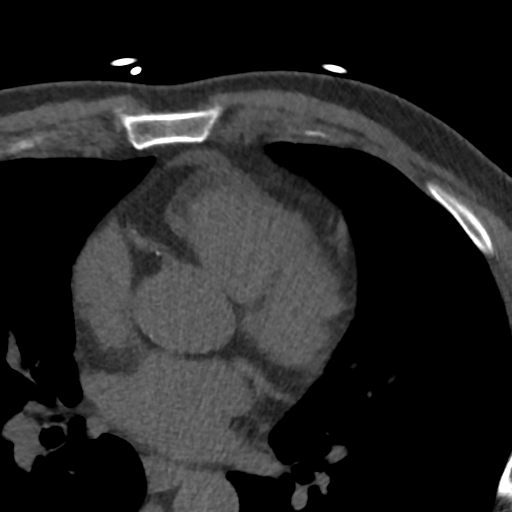
[im 34/43  vessel]
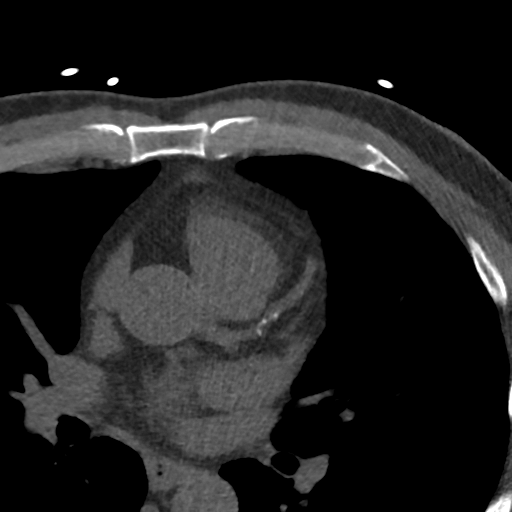
[im 38/43  vessel]
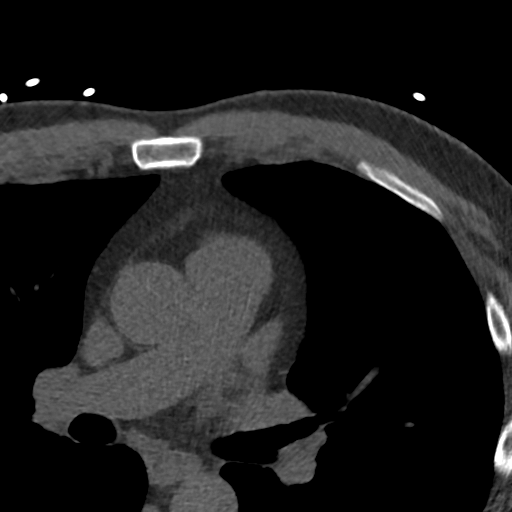
[im 38/43  lung]
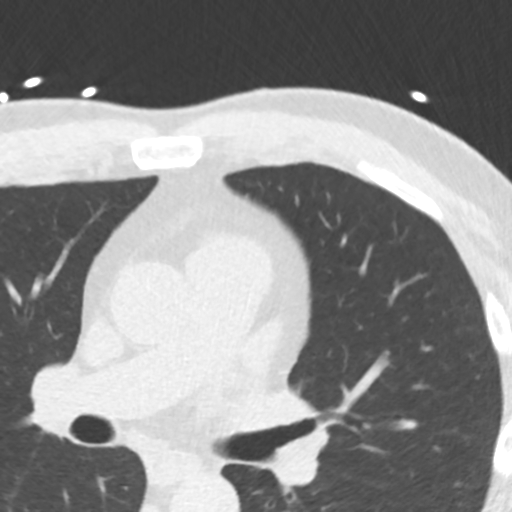

[Series 5: lung st 73 % · axial · 0.68mm/px · z∈[-188,-116]mm · 7 of 32 slices shown]
[im 4/32  lung]
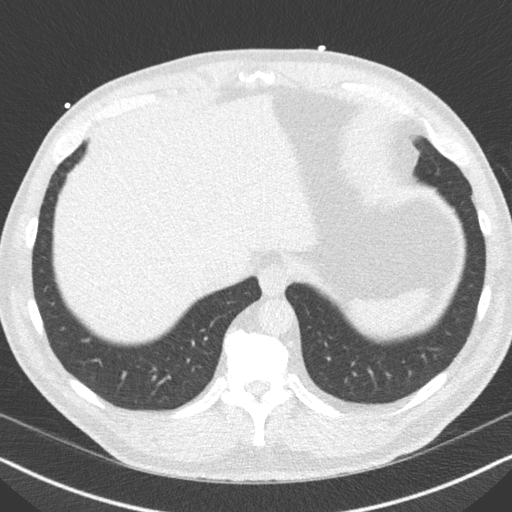
[im 8/32  lung]
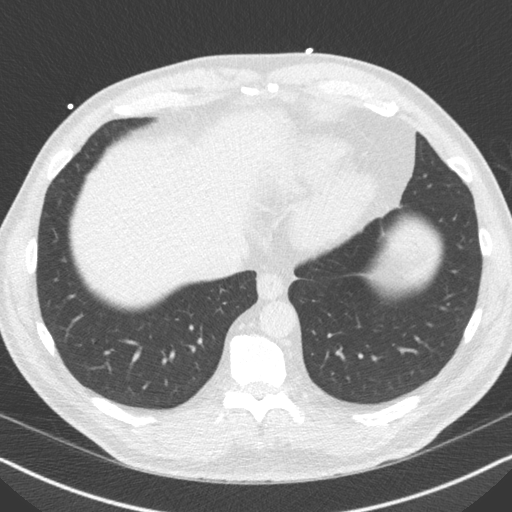
[im 12/32  lung]
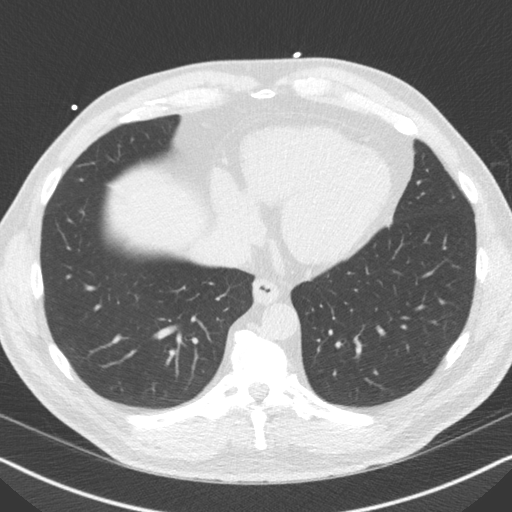
[im 16/32  lung]
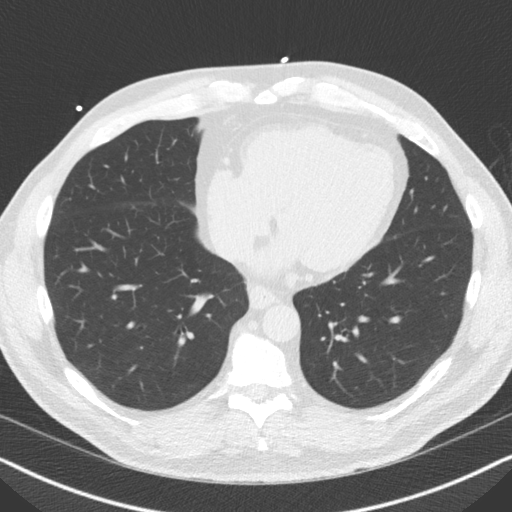
[im 20/32  lung]
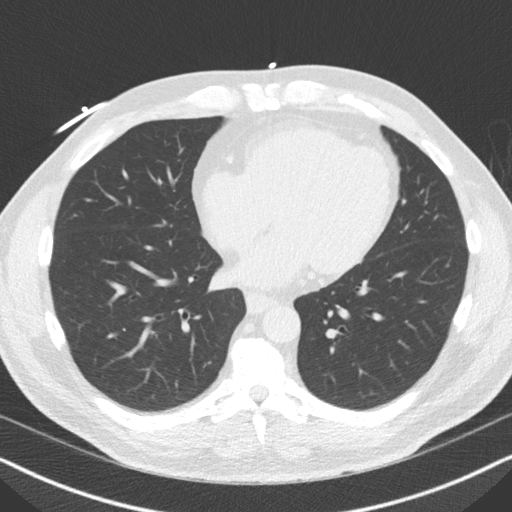
[im 24/32  lung]
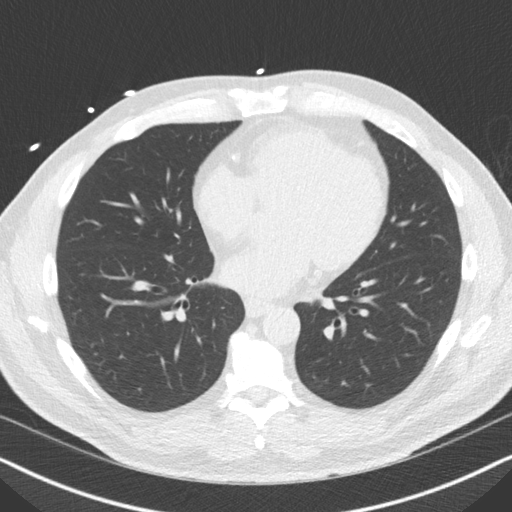
[im 28/32  lung]
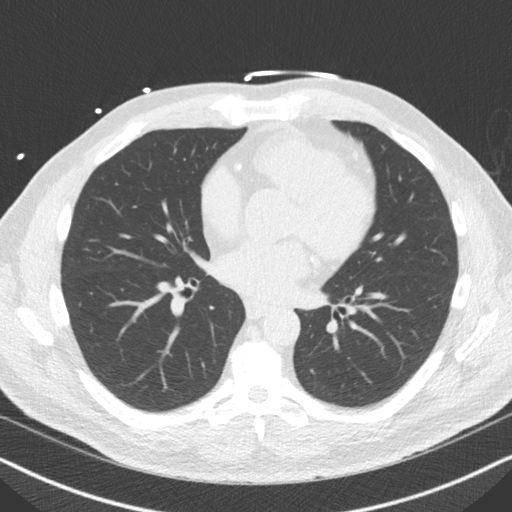

[16 of 20 positions shown; findings below may reference images not displayed]

FINDINGS: Non-cardiac: See separate report from [REDACTED].

Ascending Aorta: Normal diameter 3.3 cm

Pericardium: Normal

Coronary arteries: 3 vessel coronary calcium noted
IMPRESSION: Coronary calcium score of 195 . This was 71 st percentile for age
and sex matched control.

Kiyoaki Toku

EXAM:
OVER-READ INTERPRETATION  CT CHEST

The following report is an over-read performed by radiologist Dr.
Lkw Tiger [REDACTED] on 04/23/2017. This over-read
does not include interpretation of cardiac or coronary anatomy or
pathology. The coronary calcium interpretation by the cardiologist
is attached.
FINDINGS: Vascular: Normal aortic caliber.

Mediastinum/Nodes: No imaged thoracic adenopathy.

Lungs/Pleura: No pleural fluid. Favor subpleural lymph along the
periphery of the right middle lobe on image [DATE]. This measures
approximately 3 mm. Lungs are otherwise clear.

Upper Abdomen: Normal imaged portions of the liver, spleen.

Musculoskeletal: Mild thoracic spondylosis.
IMPRESSION: No acute findings in the imaged extracardiac chest.

## 2019-05-06 ENCOUNTER — Other Ambulatory Visit: Payer: Self-pay | Admitting: Family Medicine

## 2019-05-12 ENCOUNTER — Encounter: Payer: Self-pay | Admitting: Family Medicine

## 2019-05-12 ENCOUNTER — Other Ambulatory Visit: Payer: Self-pay

## 2019-05-12 ENCOUNTER — Ambulatory Visit (INDEPENDENT_AMBULATORY_CARE_PROVIDER_SITE_OTHER): Payer: PPO | Admitting: Family Medicine

## 2019-05-12 VITALS — BP 128/72 | HR 69 | Temp 97.6°F | Ht 69.0 in | Wt 211.3 lb

## 2019-05-12 DIAGNOSIS — F5101 Primary insomnia: Secondary | ICD-10-CM | POA: Diagnosis not present

## 2019-05-12 DIAGNOSIS — E785 Hyperlipidemia, unspecified: Secondary | ICD-10-CM | POA: Diagnosis not present

## 2019-05-12 DIAGNOSIS — C61 Malignant neoplasm of prostate: Secondary | ICD-10-CM

## 2019-05-12 DIAGNOSIS — Z23 Encounter for immunization: Secondary | ICD-10-CM

## 2019-05-12 LAB — BASIC METABOLIC PANEL
BUN: 18 mg/dL (ref 6–23)
CO2: 30 mEq/L (ref 19–32)
Calcium: 9.9 mg/dL (ref 8.4–10.5)
Chloride: 103 mEq/L (ref 96–112)
Creatinine, Ser: 1.03 mg/dL (ref 0.40–1.50)
GFR: 72.38 mL/min (ref >60.00)
Glucose, Bld: 107 mg/dL — ABNORMAL HIGH (ref 70–99)
Potassium: 4 mEq/L (ref 3.5–5.1)
Sodium: 139 mEq/L (ref 135–145)

## 2019-05-12 LAB — LIPID PANEL
Cholesterol: 176 mg/dL (ref 0–200)
HDL: 48.4 mg/dL (ref >39.00)
NonHDL: 127.34
Total CHOL/HDL Ratio: 4
Triglycerides: 392 mg/dL — ABNORMAL HIGH (ref 0.0–149.0)
VLDL: 78.4 mg/dL — ABNORMAL HIGH (ref 0.0–40.0)

## 2019-05-12 LAB — HEPATIC FUNCTION PANEL
ALT: 33 U/L (ref 0–53)
AST: 20 U/L (ref 0–37)
Albumin: 4.4 g/dL (ref 3.5–5.2)
Alkaline Phosphatase: 59 U/L (ref 39–117)
Bilirubin, Direct: 0.1 mg/dL (ref 0.0–0.3)
Total Bilirubin: 0.5 mg/dL (ref 0.2–1.2)
Total Protein: 7 g/dL (ref 6.0–8.3)

## 2019-05-12 LAB — LDL CHOLESTEROL, DIRECT: Direct LDL: 94 mg/dL

## 2019-05-12 MED ORDER — SIMVASTATIN 20 MG PO TABS: 20.0000 mg | ORAL_TABLET | Freq: Every day | ORAL | 3 refills | Status: DC

## 2019-05-12 MED ORDER — TRAZODONE HCL 50 MG PO TABS: 25.0000 mg | ORAL_TABLET | Freq: Every evening | ORAL | 5 refills | Status: DC | PRN

## 2019-05-12 NOTE — Progress Notes (Signed)
  Subjective:     Patient ID: Curtis Sheppard, male   DOB: 11/25/1955, 64 y.o.   MRN: [id number]  HPI Curtis Sheppard is seen for medical follow-up.  He has prostate cancer and is on Lupron injections.  He had hot flashes related to that.  He gets those every 6 months.  His last PSA was 0.01.  He just turned 64.  Needs Pneumovax.  Other immunizations up-to-date.  He has hyperlipidemia treated with simvastatin.  Overdue for follow-up labs.  Needs refills of simvastatin.  No significant myalgias.  Major complaint is intermittent insomnia.  Has taken Ambien in the past sporadically but sometimes feels hangover drowsiness next day.  Occasionally taken alprazolam per neurology which seemed to help.  He is not aware of trying trazodone.  No relief with over-the-counter medications.  He has history of seizure disorder followed by neurology  Past Medical History:  Diagnosis Date  . Epileptic seizure ([place]) 02/21/2013   Single event.   . Hyperlipidemia   . Seizures ([place])   . UTI (urinary tract infection)    Past Surgical History:  Procedure Laterality Date  . NASAL SEPTUM SURGERY  1975   deviated    reports that he has never smoked. He has never used smokeless tobacco. He reports current alcohol use. He reports that he does not use drugs. family history includes Bipolar disorder in his brother; Cancer in his father and mother; Dementia in his maternal grandmother; Heart disease (age of onset: 22) in his brother; Stroke in his paternal grandfather. Allergies  Allergen Reactions  . Penicillins Other (See Comments)    Unknown      Review of Systems  Constitutional: Negative for fatigue.  Eyes: Negative for visual disturbance.  Respiratory: Negative for cough, chest tightness and shortness of breath.   Cardiovascular: Negative for chest pain, palpitations and leg swelling.  Endocrine: Negative for polydipsia and polyuria.  Neurological: Negative for dizziness, syncope, weakness, light-headedness and  headaches.  Psychiatric/Behavioral: Positive for sleep disturbance. Negative for dysphoric mood.       Objective:   Physical Exam Constitutional:      Appearance: He is well-developed.  HENT:     Right Ear: External ear normal.     Left Ear: External ear normal.  Eyes:     Pupils: Pupils are equal, round, and reactive to light.  Neck:     Thyroid: No thyromegaly.  Cardiovascular:     Rate and Rhythm: Normal rate and regular rhythm.  Pulmonary:     Effort: Pulmonary effort is normal. No respiratory distress.     Breath sounds: Normal breath sounds. No wheezing or rales.  Musculoskeletal:     Cervical back: Neck supple.  Neurological:     Mental Status: He is alert and oriented to person, place, and time.        Assessment:     #1 history of prostate cancer.  He is on Lupron injections and followed by urology for his PSAs  #2 chronic intermittent insomnia  #3 hyperlipidemia treated with simvastatin and due for follow-up labs      Plan:     -Check lipid, hepatic, basic metabolic panel -Patient due for Pneumovax and we recommended this and patient consented -Consider trial of trazodone 50 mg nightly -Would try to avoid regular use of benzodiazepines -Handout given on insomnia  Eulas Post MD [facility] at [facility]

## 2019-05-12 NOTE — Patient Instructions (Signed)

## 2019-05-12 NOTE — Addendum Note (Signed)
Addended by: Anibal Henderson on: 05/12/2019 02:51 PM   Modules accepted: Orders

## 2019-07-26 ENCOUNTER — Encounter: Payer: Self-pay | Admitting: Family Medicine

## 2019-07-26 MED ORDER — COLCHICINE 0.6 MG PO TABS: 0.6000 mg | ORAL_TABLET | Freq: Two times a day (BID) | ORAL | 3 refills | Status: AC | PRN

## 2019-08-24 ENCOUNTER — Other Ambulatory Visit: Payer: Self-pay | Admitting: Family Medicine

## 2019-09-02 DIAGNOSIS — Z880 Allergy status to penicillin: Secondary | ICD-10-CM | POA: Diagnosis not present

## 2019-09-02 DIAGNOSIS — L309 Dermatitis, unspecified: Secondary | ICD-10-CM | POA: Diagnosis not present

## 2019-09-20 ENCOUNTER — Encounter: Payer: Self-pay | Admitting: Family Medicine

## 2019-09-20 MED ORDER — TRIAMCINOLONE ACETONIDE 0.1 % EX CREA: 1.0000 "application " | TOPICAL_CREAM | Freq: Two times a day (BID) | CUTANEOUS | 0 refills | Status: AC | PRN

## 2019-10-20 DIAGNOSIS — C61 Malignant neoplasm of prostate: Secondary | ICD-10-CM | POA: Diagnosis not present

## 2019-11-20 ENCOUNTER — Other Ambulatory Visit: Payer: Self-pay | Admitting: Family Medicine

## 2019-11-28 DIAGNOSIS — L821 Other seborrheic keratosis: Secondary | ICD-10-CM | POA: Diagnosis not present

## 2019-11-28 DIAGNOSIS — L570 Actinic keratosis: Secondary | ICD-10-CM | POA: Diagnosis not present

## 2019-11-28 DIAGNOSIS — L298 Other pruritus: Secondary | ICD-10-CM | POA: Diagnosis not present

## 2019-11-28 DIAGNOSIS — L819 Disorder of pigmentation, unspecified: Secondary | ICD-10-CM | POA: Diagnosis not present

## 2019-11-28 DIAGNOSIS — L718 Other rosacea: Secondary | ICD-10-CM | POA: Diagnosis not present

## 2019-12-22 ENCOUNTER — Ambulatory Visit (INDEPENDENT_AMBULATORY_CARE_PROVIDER_SITE_OTHER): Payer: PPO

## 2019-12-22 DIAGNOSIS — Z0000 Encounter for general adult medical examination without abnormal findings: Secondary | ICD-10-CM | POA: Diagnosis not present

## 2019-12-22 NOTE — Patient Instructions (Signed)
Curtis Sheppard , Thank you for taking time to come for your Medicare Wellness Visit. I appreciate your ongoing commitment to your health goals. Please review the following plan we discussed and let me know if I can assist you in the future.   Screening recommendations/referrals: Colonoscopy: up to date, next due 12/29/2027 Recommended yearly ophthalmology/optometry visit for glaucoma screening and checkup Recommended yearly dental visit for hygiene and checkup  Vaccinations: Influenza vaccine: Up to date, next due fall 2022 Pneumococcal vaccine: Up to date, next due 05/11/2020 Tdap vaccine: Up to date, next due 11/20/2029 Shingles vaccine: Currently due for Shingrix, you may receive at your local pharmacy     Advanced directives: Please bring copies of your advanced medical directives so that we may scan them into your chart.  Conditions/risks identified: None   Next appointment: None   Preventive Care 65 Years and Older, Male Preventive care refers to lifestyle choices and visits with your health care provider that can promote health and wellness. What does preventive care include?  A yearly physical exam. This is also called an annual well check.  Dental exams once or twice a year.  Routine eye exams. Ask your health care provider how often you should have your eyes checked.  Personal lifestyle choices, including:  Daily care of your teeth and gums.  Regular physical activity.  Eating a healthy diet.  Avoiding tobacco and drug use.  Limiting alcohol use.  Practicing safe sex.  Taking low doses of aspirin every day.  Taking vitamin and mineral supplements as recommended by your health care provider. What happens during an annual well check? The services and screenings done by your health care provider during your annual well check will depend on your age, overall health, lifestyle risk factors, and family history of disease. Counseling  Your health care provider may  ask you questions about your:  Alcohol use.  Tobacco use.  Drug use.  Emotional well-being.  Home and relationship well-being.  Sexual activity.  Eating habits.  History of falls.  Memory and ability to understand (cognition).  Work and work Statistician. Screening  You may have the following tests or measurements:  Height, weight, and BMI.  Blood pressure.  Lipid and cholesterol levels. These may be checked every 5 years, or more frequently if you are over 23 years old.  Skin check.  Lung cancer screening. You may have this screening every year starting at age 44 if you have a 30-pack-year history of smoking and currently smoke or have quit within the past 15 years.  Fecal occult blood test (FOBT) of the stool. You may have this test every year starting at age 21.  Flexible sigmoidoscopy or colonoscopy. You may have a sigmoidoscopy every 5 years or a colonoscopy every 10 years starting at age 12.  Prostate cancer screening. Recommendations will vary depending on your family history and other risks.  Hepatitis C blood test.  Hepatitis B blood test.  Sexually transmitted disease (STD) testing.  Diabetes screening. This is done by checking your blood sugar (glucose) after you have not eaten for a while (fasting). You may have this done every 1-3 years.  Abdominal aortic aneurysm (AAA) screening. You may need this if you are a current or former smoker.  Osteoporosis. You may be screened starting at age 26 if you are at high risk. Talk with your health care provider about your test results, treatment options, and if necessary, the need for more tests. Vaccines  Your health care provider  may recommend certain vaccines, such as:  Influenza vaccine. This is recommended every year.  Tetanus, diphtheria, and acellular pertussis (Tdap, Td) vaccine. You may need a Td booster every 10 years.  Zoster vaccine. You may need this after age 72.  Pneumococcal 13-valent  conjugate (PCV13) vaccine. One dose is recommended after age 33.  Pneumococcal polysaccharide (PPSV23) vaccine. One dose is recommended after age 71. Talk to your health care provider about which screenings and vaccines you need and how often you need them. This information is not intended to replace advice given to you by your health care provider. Make sure you discuss any questions you have with your health care provider. Document Released: 03/15/2015 Document Revised: 11/06/2015 Document Reviewed: 12/18/2014 Elsevier Interactive Patient Education  2017 [place] Prevention in the Home Falls can cause injuries. They can happen to people of all ages. There are many things you can do to make your home safe and to help prevent falls. What can I do on the outside of my home?  Regularly fix the edges of walkways and driveways and fix any cracks.  Remove anything that might make you trip as you walk through a door, such as a raised step or threshold.  Trim any bushes or trees on the path to your home.  Use bright outdoor lighting.  Clear any walking paths of anything that might make someone trip, such as rocks or tools.  Regularly check to see if handrails are loose or broken. Make sure that both sides of any steps have handrails.  Any raised decks and porches should have guardrails on the edges.  Have any leaves, snow, or ice cleared regularly.  Use sand or salt on walking paths during winter.  Clean up any spills in your garage right away. This includes oil or grease spills. What can I do in the bathroom?  Use night lights.  Install grab bars by the toilet and in the tub and shower. Do not use towel bars as grab bars.  Use non-skid mats or decals in the tub or shower.  If you need to sit down in the shower, use a plastic, non-slip stool.  Keep the floor dry. Clean up any water that spills on the floor as soon as it happens.  Remove soap buildup in the tub or  shower regularly.  Attach bath mats securely with double-sided non-slip rug tape.  Do not have throw rugs and other things on the floor that can make you trip. What can I do in the bedroom?  Use night lights.  Make sure that you have a light by your bed that is easy to reach.  Do not use any sheets or blankets that are too big for your bed. They should not hang down onto the floor.  Have a firm chair that has side arms. You can use this for support while you get dressed.  Do not have throw rugs and other things on the floor that can make you trip. What can I do in the kitchen?  Clean up any spills right away.  Avoid walking on wet floors.  Keep items that you use a lot in easy-to-reach places.  If you need to reach something above you, use a strong step stool that has a grab bar.  Keep electrical cords out of the way.  Do not use floor polish or wax that makes floors slippery. If you must use wax, use non-skid floor wax.  Do not have throw rugs  and other things on the floor that can make you trip. What can I do with my stairs?  Do not leave any items on the stairs.  Make sure that there are handrails on both sides of the stairs and use them. Fix handrails that are broken or loose. Make sure that handrails are as long as the stairways.  Check any carpeting to make sure that it is firmly attached to the stairs. Fix any carpet that is loose or worn.  Avoid having throw rugs at the top or bottom of the stairs. If you do have throw rugs, attach them to the floor with carpet tape.  Make sure that you have a light switch at the top of the stairs and the bottom of the stairs. If you do not have them, ask someone to add them for you. What else can I do to help prevent falls?  Wear shoes that:  Do not have high heels.  Have rubber bottoms.  Are comfortable and fit you well.  Are closed at the toe. Do not wear sandals.  If you use a stepladder:  Make sure that it is fully  opened. Do not climb a closed stepladder.  Make sure that both sides of the stepladder are locked into place.  Ask someone to hold it for you, if possible.  Clearly mark and make sure that you can see:  Any grab bars or handrails.  First and last steps.  Where the edge of each step is.  Use tools that help you move around (mobility aids) if they are needed. These include:  Canes.  Walkers.  Scooters.  Crutches.  Turn on the lights when you go into a dark area. Replace any light bulbs as soon as they burn out.  Set up your furniture so you have a clear path. Avoid moving your furniture around.  If any of your floors are uneven, fix them.  If there are any pets around you, be aware of where they are.  Review your medicines with your doctor. Some medicines can make you feel dizzy. This can increase your chance of falling. Ask your doctor what other things that you can do to help prevent falls. This information is not intended to replace advice given to you by your health care provider. Make sure you discuss any questions you have with your health care provider. Document Released: 12/13/2008 Document Revised: 07/25/2015 Document Reviewed: 03/23/2014 Elsevier Interactive Patient Education  2017 [employer].

## 2019-12-22 NOTE — Progress Notes (Signed)
Subjective:   Curtis Sheppard is a 64 y.o. male who presents for an Initial Medicare Annual Wellness Visit.  I connected with Barbie Banner today by telephone and verified that I am speaking with the correct person using two identifiers. Location patient: home Location provider: work Persons participating in the virtual visit: patient, provider.   I discussed the limitations, risks, security and privacy concerns of performing an evaluation and management service by telephone and the availability of in person appointments. I also discussed with the patient that there may be a patient responsible charge related to this service. The patient expressed understanding and verbally consented to this telephonic visit.    Interactive audio and video telecommunications were attempted between this provider and patient, however failed, due to patient having technical difficulties OR patient did not have access to video capability.  We continued and completed visit with audio only.     Review of Systems    N/A  Cardiac Risk Factors include: advanced age (>73men, >57 women);dyslipidemia;male gender     Objective:    Today's Vitals   There is no height or weight on file to calculate BMI.  Advanced Directives 12/22/2019  Does Patient Have a Medical Advance Directive? Yes  Type of Paramedic of [place];Living will  Does patient want to make changes to medical advance directive? No - Patient declined  Copy of [place] in Chart? No - copy requested    Current Medications (verified) Outpatient Encounter Medications as of 12/22/2019  Medication Sig  . diazepam (VALIUM) 2 MG tablet TAKE 1 TABLET BY MOUTH AT BEDTIME FOR INSOMNIA AND BLADDER RELAXATION  . Diclofenac Sodium (PENNSAID) 2 % SOLN Place 1 application onto the skin 2 (two) times daily.  . Omega-3 Fatty Acids (FISH OIL) 1200 MG CAPS Take 2 capsules by mouth 2 (two) times daily.   .  simvastatin (ZOCOR) 20 MG tablet Take 1 tablet (20 mg total) by mouth at bedtime.  . tamsulosin (FLOMAX) 0.4 MG CAPS capsule TAKE 1 CAPSULE BY MOUTH EVERY DAY  . traZODone (DESYREL) 50 MG tablet TAKE 1/2 TO 1 TABLET BY MOUTH AT BEDTIME AS NEEDED FOR SLEEP  . triamcinolone cream (KENALOG) 0.1 % Apply 1 application topically 2 (two) times daily as needed. Compound 1:1 with Eucerin and apply to affected skin bid prn  . TURMERIC PO Take 1,000 mg by mouth.  . zolpidem (AMBIEN) 10 MG tablet TAKE 1 TABLET BY MOUTH AT BEDTIME AS NEEDED FOR SLEEP  . colchicine 0.6 MG tablet Take 1 tablet (0.6 mg total) by mouth 2 (two) times daily as needed. For gout. (Patient not taking: Reported on 12/22/2019)  . fluconazole (DIFLUCAN) 100 MG tablet Take 1 tablet (100 mg total) by mouth daily. (Patient not taking: Reported on 05/12/2019)  . nystatin cream (MYCOSTATIN) Apply 1 application topically 2 (two) times daily.  . sildenafil (VIAGRA) 100 MG tablet Take 0.5-1 tablets (50-100 mg total) by mouth daily as needed for erectile dysfunction. (Patient not taking: Reported on 12/22/2019)  . triamcinolone (NASACORT) 55 MCG/ACT AERO nasal inhaler Place 2 sprays into the nose at bedtime as needed. (Patient not taking: Reported on 12/22/2019)   No facility-administered encounter medications on file as of 12/22/2019.    Allergies (verified) Penicillins   History: Past Medical History:  Diagnosis Date  . Epileptic seizure ([place]) 02/21/2013   Single event.   . Hyperlipidemia   . Seizures ([place])   . UTI (urinary tract infection)    Past  Surgical History:  Procedure Laterality Date  . NASAL SEPTUM SURGERY  1975   deviated   Family History  Problem Relation Age of Onset  . Cancer Mother        lung  . Cancer Father        lung  . Bipolar disorder Brother        paranoid manic, depressive bipolar  . Heart disease Brother 3       MI  . Stroke Paternal Grandfather   . Dementia Maternal Grandmother    Social  History   Socioeconomic History  . Marital status: Married    Spouse name: tracy  . Number of children: 2  . Years of education: college  . Highest education level: Not on file  Occupational History  . Occupation: Artist  Tobacco Use  . Smoking status: Never Smoker  . Smokeless tobacco: Never Used  Vaping Use  . Vaping Use: Never used  Substance and Sexual Activity  . Alcohol use: Yes  . Drug use: No  . Sexual activity: Not on file  Other Topics Concern  . Not on file  Social History Narrative  . Not on file   Social Determinants of Health   Financial Resource Strain: Low Risk   . Difficulty of Paying Living Expenses: Not hard at all  Food Insecurity: No Food Insecurity  . Worried About Charity fundraiser in the Last Year: Never true  . Ran Out of Food in the Last Year: Never true  Transportation Needs: No Transportation Needs  . Lack of Transportation (Medical): No  . Lack of Transportation (Non-Medical): No  Physical Activity: Sufficiently Active  . Days of Exercise per Week: 4 days  . Minutes of Exercise per Session: 40 min  Stress: No Stress Concern Present  . Feeling of Stress : Not at all  Social Connections: Moderately Isolated  . Frequency of Communication with Friends and Family: More than three times a week  . Frequency of Social Gatherings with Friends and Family: Once a week  . Attends Religious Services: Never  . Active Member of Clubs or Organizations: No  . Attends Archivist Meetings: Never  . Marital Status: Married    Tobacco Counseling Counseling given: Not Answered   Clinical Intake:  Pre-visit preparation completed: Yes  Pain : No/denies pain     Nutritional Risks: None Diabetes: No  How often do you need to have someone help you when you read instructions, pamphlets, or other written materials from your doctor or pharmacy?: 1 - Never What is the last grade level you completed in school?:  College  Diabetic?No  Interpreter Needed?: No  Information entered by :: [place] of Daily Living In your present state of health, do you have any difficulty performing the following activities: 12/22/2019  Hearing? N  Vision? N  Difficulty concentrating or making decisions? N  Walking or climbing stairs? N  Dressing or bathing? N  Doing errands, shopping? N  Preparing Food and eating ? N  Using the Toilet? N  In the past six months, have you accidently leaked urine? N  Do you have problems with loss of bowel control? N  Managing your Medications? N  Managing your Finances? N  Housekeeping or managing your Housekeeping? N  Some recent data might be hidden    Patient Care Team: Eulas Post, MD as PCP - General (Family Medicine)  Indicate any recent Medical Services you may have received from  other than [facility] providers in the past year (date may be approximate).     Assessment:   This is a routine wellness examination for Curtis Sheppard.  Hearing/Vision screen  Hearing Screening   125Hz  250Hz  500Hz  1000Hz  2000Hz  3000Hz  4000Hz  6000Hz  8000Hz   Right ear:           Left ear:           Vision Screening Comments: Patient states gets eyes checked once every two years  Dietary issues and exercise activities discussed: Current Exercise Habits: Home exercise routine, Type of exercise: walking;strength training/weights, Time (Minutes): 35, Frequency (Times/Week): 4, Weekly Exercise (Minutes/Week): 140, Intensity: Moderate, Exercise limited by: None identified  Goals    . Patient Stated     I will continue to walk 3-4 miles 3-4 times per week      Depression Screen PHQ 2/9 Scores 12/22/2019 05/12/2019 03/11/2018  PHQ - 2 Score 0 0 0  PHQ- 9 Score 0 0 -    Fall Risk Fall Risk  12/22/2019 05/12/2019 05/12/2019  Falls in the past year? 0 0 0  Number falls in past yr: 0 - -  Injury with Fall? 0 - -  Risk for fall due to : No Fall Risks - -  Follow up Falls  evaluation completed;Falls prevention discussed - -    Any stairs in or around the home? Yes  If so, are there any without handrails? No  Home free of loose throw rugs in walkways, pet beds, electrical cords, etc? Yes  Adequate lighting in your home to reduce risk of falls? Yes   ASSISTIVE DEVICES UTILIZED TO PREVENT FALLS:  Life alert? No  Use of a cane, walker or w/c? No  Grab bars in the bathroom? Yes  Shower chair or bench in shower? No  Elevated toilet seat or a handicapped toilet? No     Cognitive Function:  Cognitive screening not indicated based on direct observation      Immunizations Immunization History  Administered Date(s) Administered  . Fluad Quad(high Dose 65+) 12/03/2018  . Influenza Inj Mdck Quad Pf 11/22/2017  . Influenza, High Dose Seasonal PF 11/21/2019  . Influenza,inj,Quad PF,6+ Mos 04/01/2015, 12/19/2015, 03/09/2017  . Influenza-Unspecified 11/25/2013  . PFIZER SARS-COV-2 Vaccination 03/27/2019, 04/17/2019  . Pneumococcal Polysaccharide-23 05/12/2019  . Td 03/28/2007  . Tdap 03/09/2017  . Zoster 06/10/2015    TDAP status: Up to date Flu Vaccine status: Up to date Pneumococcal vaccine status: Up to date Covid-19 vaccine status: Completed vaccines  Qualifies for Shingles Vaccine? Yes   Zostavax completed Yes   Shingrix Completed?: No.    Education has been provided regarding the importance of this vaccine. Patient has been advised to call insurance company to determine out of pocket expense if they have not yet received this vaccine. Advised may also receive vaccine at local pharmacy or Health Dept. Verbalized acceptance and understanding.  Screening Tests Health Maintenance  Topic Date Due  . PNA vac Low Risk Adult (2 of 2 - PCV13) 05/11/2020  . TETANUS/TDAP  03/10/2027  . COLONOSCOPY  12/29/2027  . INFLUENZA VACCINE  Completed  . COVID-19 Vaccine  Completed  . Hepatitis C Screening  Completed    Health Maintenance  There are no  preventive care reminders to display for this patient.  Colorectal cancer screening: Completed 12/28/2017. Repeat every 10 years  Lung Cancer Screening: (Low Dose CT Chest recommended if Age 101-80 years, 30 pack-year currently smoking OR have quit w/in 15years.) does not qualify.  Lung Cancer Screening Referral: N/A   Additional Screening:  Hepatitis C Screening: does qualify; Completed 04/06/2016  Vision Screening: Recommended annual ophthalmology exams for early detection of glaucoma and other disorders of the eye. Is the patient up to date with their annual eye exam?  Yes  Who is the provider or what is the name of the office in which the patient attends annual eye exams? Dr. Macarthur Critchley If pt is not established with a provider, would they like to be referred to a provider to establish care? No .   Dental Screening: Recommended annual dental exams for proper oral hygiene  Community Resource Referral / Chronic Care Management: CRR required this visit?  No   CCM required this visit?  No      Plan:     I have personally reviewed and noted the following in the patient's chart:   . Medical and social history . Use of alcohol, tobacco or illicit drugs  . Current medications and supplements . Functional ability and status . Nutritional status . Physical activity . Advanced directives . List of other physicians . Hospitalizations, surgeries, and ER visits in previous 12 months . Vitals . Screenings to include cognitive, depression, and falls . Referrals and appointments  In addition, I have reviewed and discussed with patient certain preventive protocols, quality metrics, and best practice recommendations. A written personalized care plan for preventive services as well as general preventive health recommendations were provided to patient.     Ofilia Neas, LPN   [id number]   Nurse Notes: None

## 2020-02-16 ENCOUNTER — Telehealth: Payer: Self-pay | Admitting: Family Medicine

## 2020-02-16 NOTE — Progress Notes (Signed)
  Chronic Care Management   Outreach Note  02/16/2020 Name: Verdun Rackley MRN: [id number] DOB: November 24, 1955  Referred by: Eulas Post, MD Reason for referral : No chief complaint on file.   An unsuccessful telephone outreach was attempted today. The patient was referred to the pharmacist for assistance with care management and care coordination.   Follow Up Plan:   Carley Perdue [employer] Scheduler

## 2020-03-15 ENCOUNTER — Telehealth: Payer: Self-pay | Admitting: Family Medicine

## 2020-03-15 NOTE — Progress Notes (Signed)
  Chronic Care Management   Outreach Note  03/15/2020 Name: Curtis Sheppard MRN: [id number] DOB: 06-01-1955  Referred by: Eulas Post, MD Reason for referral : No chief complaint on file.   A second unsuccessful telephone outreach was attempted today. The patient was referred to pharmacist for assistance with care management and care coordination.  Follow Up Plan:   Carley Perdue [employer] Scheduler

## 2020-04-02 ENCOUNTER — Other Ambulatory Visit: Payer: Self-pay | Admitting: Family Medicine

## 2020-04-15 DIAGNOSIS — C61 Malignant neoplasm of prostate: Secondary | ICD-10-CM | POA: Diagnosis not present

## 2020-04-22 DIAGNOSIS — Z5111 Encounter for antineoplastic chemotherapy: Secondary | ICD-10-CM | POA: Diagnosis not present

## 2020-04-22 DIAGNOSIS — C61 Malignant neoplasm of prostate: Secondary | ICD-10-CM | POA: Diagnosis not present

## 2020-05-15 ENCOUNTER — Other Ambulatory Visit: Payer: Self-pay | Admitting: Family Medicine

## 2020-05-15 ENCOUNTER — Encounter: Payer: PPO | Admitting: Family Medicine

## 2020-05-28 ENCOUNTER — Other Ambulatory Visit: Payer: Self-pay

## 2020-05-29 ENCOUNTER — Ambulatory Visit (INDEPENDENT_AMBULATORY_CARE_PROVIDER_SITE_OTHER): Payer: PPO | Admitting: Family Medicine

## 2020-05-29 ENCOUNTER — Encounter: Payer: Self-pay | Admitting: Family Medicine

## 2020-05-29 VITALS — BP 128/72 | HR 76 | Temp 97.9°F | Ht 69.0 in | Wt 203.6 lb

## 2020-05-29 DIAGNOSIS — Z23 Encounter for immunization: Secondary | ICD-10-CM

## 2020-05-29 DIAGNOSIS — Z0000 Encounter for general adult medical examination without abnormal findings: Secondary | ICD-10-CM

## 2020-05-29 LAB — CBC WITH DIFFERENTIAL/PLATELET
Basophils Absolute: 0 10*3/uL (ref 0.0–0.1)
Basophils Relative: 0.8 % (ref 0.0–3.0)
Eosinophils Absolute: 0.1 10*3/uL (ref 0.0–0.7)
Eosinophils Relative: 2 % (ref 0.0–5.0)
HCT: 38.3 % — ABNORMAL LOW (ref 39.0–52.0)
Hemoglobin: 13.4 g/dL (ref 13.0–17.0)
Lymphocytes Relative: 28.8 % (ref 12.0–46.0)
Lymphs Abs: 1.6 10*3/uL (ref 0.7–4.0)
MCHC: 34.9 g/dL (ref 30.0–36.0)
MCV: 94.1 fl (ref 78.0–100.0)
Monocytes Absolute: 0.4 10*3/uL (ref 0.1–1.0)
Monocytes Relative: 7.5 % (ref 3.0–12.0)
Neutro Abs: 3.5 10*3/uL (ref 1.4–7.7)
Neutrophils Relative %: 60.9 % (ref 43.0–77.0)
Platelets: 198 10*3/uL (ref 150.0–400.0)
RBC: 4.07 Mil/uL — ABNORMAL LOW (ref 4.22–5.81)
RDW: 13.3 % (ref 11.5–15.5)
WBC: 5.7 10*3/uL (ref 4.0–10.5)

## 2020-05-29 LAB — LIPID PANEL
Cholesterol: 162 mg/dL (ref 0–200)
HDL: 50 mg/dL (ref >39.00)
LDL Cholesterol: 80 mg/dL (ref 0–99)
NonHDL: 111.96
Total CHOL/HDL Ratio: 3
Triglycerides: 160 mg/dL — ABNORMAL HIGH (ref 0.0–149.0)
VLDL: 32 mg/dL (ref 0.0–40.0)

## 2020-05-29 LAB — BASIC METABOLIC PANEL
BUN: 19 mg/dL (ref 6–23)
CO2: 23 mEq/L (ref 19–32)
Calcium: 9.3 mg/dL (ref 8.4–10.5)
Chloride: 107 mEq/L (ref 96–112)
Creatinine, Ser: 0.96 mg/dL (ref 0.40–1.50)
GFR: 82.37 mL/min (ref >60.00)
Glucose, Bld: 114 mg/dL — ABNORMAL HIGH (ref 70–99)
Potassium: 4.2 mEq/L (ref 3.5–5.1)
Sodium: 140 mEq/L (ref 135–145)

## 2020-05-29 LAB — HEPATIC FUNCTION PANEL
ALT: 36 U/L (ref 0–53)
AST: 36 U/L (ref 0–37)
Albumin: 4.6 g/dL (ref 3.5–5.2)
Alkaline Phosphatase: 47 U/L (ref 39–117)
Bilirubin, Direct: 0.2 mg/dL (ref 0.0–0.3)
Total Bilirubin: 1.1 mg/dL (ref 0.2–1.2)
Total Protein: 6.9 g/dL (ref 6.0–8.3)

## 2020-05-29 LAB — TSH: TSH: 1.91 u[IU]/mL (ref 0.35–4.50)

## 2020-05-29 NOTE — Patient Instructions (Signed)
Preventive Care 67 Years and Older, Male Preventive care refers to lifestyle choices and visits with your health care provider that can promote health and wellness. This includes:  A yearly physical exam. This is also called an annual wellness visit.  Regular dental and eye exams.  Immunizations.  Screening for certain conditions.  Healthy lifestyle choices, such as: ? Eating a healthy diet. ? Getting regular exercise. ? Not using drugs or products that contain nicotine and tobacco. ? Limiting alcohol use. What can I expect for my preventive care visit? Physical exam Your health care provider will check your:  Height and weight. These may be used to calculate your BMI (body mass index). BMI is a measurement that tells if you are at a healthy weight.  Heart rate and blood pressure.  Body temperature.  Skin for abnormal spots. Counseling Your health care provider may ask you questions about your:  Past medical problems.  Family's medical history.  Alcohol, tobacco, and drug use.  Emotional well-being.  Home life and relationship well-being.  Sexual activity.  Diet, exercise, and sleep habits.  History of falls.  Memory and ability to understand (cognition).  Work and work environment.  Access to firearms. What immunizations do I need? Vaccines are usually given at various ages, according to a schedule. Your health care provider will recommend vaccines for you based on your age, medical history, and lifestyle or other factors, such as travel or where you work.   What tests do I need? Blood tests  Lipid and cholesterol levels. These may be checked every 5 years, or more often depending on your overall health.  Hepatitis C test.  Hepatitis B test. Screening  Lung cancer screening. You may have this screening every year starting at age 55 if you have a 30-pack-year history of smoking and currently smoke or have quit within the past 15 years.  Colorectal  cancer screening. ? All adults should have this screening starting at age 50 and continuing until age 75. ? Your health care provider may recommend screening at age 45 if you are at increased risk. ? You will have tests every 1-10 years, depending on your results and the type of screening test.  Prostate cancer screening. Recommendations will vary depending on your family history and other risks.  Genital exam to check for testicular cancer or hernias.  Diabetes screening. ? This is done by checking your blood sugar (glucose) after you have not eaten for a while (fasting). ? You may have this done every 1-3 years.  Abdominal aortic aneurysm (AAA) screening. You may need this if you are a current or former smoker.  STD (sexually transmitted disease) testing, if you are at risk. Follow these instructions at home: Eating and drinking  Eat a diet that includes fresh fruits and vegetables, whole grains, lean protein, and low-fat dairy products. Limit your intake of foods with high amounts of sugar, saturated fats, and salt.  Take vitamin and mineral supplements as recommended by your health care provider.  Do not drink alcohol if your health care provider tells you not to drink.  If you drink alcohol: ? Limit how much you have to 0-2 drinks a day. ? Be aware of how much alcohol is in your drink. In the [place] one drink equals one 12 oz bottle of beer (355 mL), one 5 oz glass of wine (148 mL), or one 1 oz glass of hard liquor (44 mL).   Lifestyle  Take daily care of your teeth   and gums. Brush your teeth every morning and night with fluoride toothpaste. Floss one time each day.  Stay active. Exercise for at least 30 minutes 5 or more days each week.  Do not use any products that contain nicotine or tobacco, such as cigarettes, e-cigarettes, and chewing tobacco. If you need help quitting, ask your health care provider.  Do not use drugs.  If you are sexually active, practice safe sex.  Use a condom or other form of protection to prevent STIs (sexually transmitted infections).  Talk with your health care provider about taking a low-dose aspirin or statin.  Find healthy ways to cope with stress, such as: ? Meditation, yoga, or listening to music. ? Journaling. ? Talking to a trusted person. ? Spending time with friends and family. Safety  Always wear your seat belt while driving or riding in a vehicle.  Do not drive: ? If you have been drinking alcohol. Do not ride with someone who has been drinking. ? When you are tired or distracted. ? While texting.  Wear a helmet and other protective equipment during sports activities.  If you have firearms in your house, make sure you follow all gun safety procedures. What's next?  Visit your health care provider once a year for an annual wellness visit.  Ask your health care provider how often you should have your eyes and teeth checked.  Stay up to date on all vaccines. This information is not intended to replace advice given to you by your health care provider. Make sure you discuss any questions you have with your health care provider. Document Revised: 11/15/2018 Document Reviewed: 02/10/2018 Elsevier Patient Education  2021 Elsevier Inc.  

## 2020-05-29 NOTE — Progress Notes (Signed)
Established Patient Office Visit  Subjective:  Patient ID: Curtis Sheppard, male    DOB: [birth year]  Age: 65 y.o. MRN: [id number]  CC:  Chief Complaint  Patient presents with  . Annual Exam    No new concerns    HPI Curtis Sheppard presents for physical exam.  He and his wife plan to move to [facility] later this year.  She still teaches.  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  They have their first grandchild who is 71 months old.  Past history significant for seizures, history of adenocarcinoma prostate, dyslipidemia, gout  Health maintenance reviewed  -No history of shingles vaccine.  Has had Zostavax. -Has had Pneumovax but not Prevnar 13 -Tetanus up-to-date -Colonoscopy up-to-date -Previous hepatitis C antibody negative  Social history-married and has 2 children.  He has a daughter and a son.  1 grandchild 78 months old.  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  No smoking history.  Usually 1 alcoholic drink per day.  Family history-he has a brother who was just diagnosed this past year with non-Hodgkin lymphoma and melanoma.  Grandfather had lung cancer but both were smokers.  One of his brothers had coronary disease age 29.  Past Medical History:  Diagnosis Date  . Epileptic seizure ([place]) 02/21/2013   Single event.   . Hyperlipidemia   . Seizures ([place])   . UTI (urinary tract infection)     Past Surgical History:  Procedure Laterality Date  . NASAL SEPTUM SURGERY  1975   deviated    Family History  Problem Relation Age of Onset  . Cancer Mother        lung  . Cancer Father        lung  . Bipolar disorder Brother        paranoid manic, depressive bipolar  . Heart disease Brother 41       MI  . Cancer Brother        melanoma, non-Hodgkin Lymphoma  . Stroke Paternal Grandfather   . Dementia Maternal Grandmother     Social History   Socioeconomic History  . Marital status: Married    Spouse name: Curtis Sheppard  . Number of children: 2  .  Years of education: college  . Highest education level: Not on file  Occupational History  . Occupation: Artist  Tobacco Use  . Smoking status: Never Smoker  . Smokeless tobacco: Never Used  Vaping Use  . Vaping Use: Never used  Substance and Sexual Activity  . Alcohol use: Yes  . Drug use: No  . Sexual activity: Not on file  Other Topics Concern  . Not on file  Social History Narrative  . Not on file   Social Determinants of Health   Financial Resource Strain: Low Risk   . Difficulty of Paying Living Expenses: Not hard at all  Food Insecurity: No Food Insecurity  . Worried About Charity fundraiser in the Last Year: Never true  . Ran Out of Food in the Last Year: Never true  Transportation Needs: No Transportation Needs  . Lack of Transportation (Medical): No  . Lack of Transportation (Non-Medical): No  Physical Activity: Sufficiently Active  . Days of Exercise per Week: 4 days  . Minutes of Exercise per Session: 40 min  Stress: No Stress Concern Present  . Feeling of Stress : Not at all  Social Connections: Moderately Isolated  . Frequency of Communication with Friends and Family: More than three  times a week  . Frequency of Social Gatherings with Friends and Family: Once a week  . Attends Religious Services: Never  . Active Member of Clubs or Organizations: No  . Attends Archivist Meetings: Never  . Marital Status: Married  Human resources officer Violence: Not At Risk  . Fear of Current or Ex-Partner: No  . Emotionally Abused: No  . Physically Abused: No  . Sexually Abused: No    Outpatient Medications Prior to Visit  Medication Sig Dispense Refill  . colchicine 0.6 MG tablet Take 1 tablet (0.6 mg total) by mouth 2 (two) times daily as needed. For gout. 60 tablet 3  . diazepam (VALIUM) 2 MG tablet TAKE 1 TABLET BY MOUTH AT BEDTIME FOR INSOMNIA AND BLADDER RELAXATION 30 tablet 0  . Diclofenac Sodium (PENNSAID) 2 % SOLN Place 1 application  onto the skin 2 (two) times daily. 1 Bottle 3  . fluconazole (DIFLUCAN) 100 MG tablet Take 1 tablet (100 mg total) by mouth daily. 7 tablet 0  . megestrol (MEGACE) 20 MG tablet Take 20 mg by mouth daily.    Marland Kitchen nystatin cream (MYCOSTATIN) Apply 1 application topically 2 (two) times daily. 30 g 0  . Omega-3 Fatty Acids (FISH OIL) 1200 MG CAPS Take 2 capsules by mouth 2 (two) times daily.    . sildenafil (VIAGRA) 100 MG tablet Take 0.5-1 tablets (50-100 mg total) by mouth daily as needed for erectile dysfunction. 10 tablet 11  . simvastatin (ZOCOR) 20 MG tablet TAKE 1 TABLET BY MOUTH EVERYDAY AT BEDTIME 90 tablet 3  . tamsulosin (FLOMAX) 0.4 MG CAPS capsule TAKE 1 CAPSULE BY MOUTH EVERY DAY 90 capsule 3  . traZODone (DESYREL) 50 MG tablet TAKE 1/2 TO 1 TABLET BY MOUTH AT BEDTIME AS NEEDED FOR SLEEP 90 tablet 2  . triamcinolone (NASACORT) 55 MCG/ACT AERO nasal inhaler Place 2 sprays into the nose at bedtime as needed. 1 Inhaler 12  . triamcinolone cream (KENALOG) 0.1 % Apply 1 application topically 2 (two) times daily as needed. Compound 1:1 with Eucerin and apply to affected skin bid prn 454 g 0  . TURMERIC PO Take 1,000 mg by mouth.    . zolpidem (AMBIEN) 10 MG tablet TAKE 1 TABLET BY MOUTH AT BEDTIME AS NEEDED FOR SLEEP 30 tablet 1   No facility-administered medications prior to visit.    Allergies  Allergen Reactions  . Penicillins Other (See Comments)    Unknown     ROS Review of Systems  Constitutional: Negative for activity change, appetite change, fatigue and fever.  HENT: Negative for congestion, ear pain and trouble swallowing.   Eyes: Negative for pain and visual disturbance.  Respiratory: Negative for cough, shortness of breath and wheezing.   Cardiovascular: Negative for chest pain and palpitations.  Gastrointestinal: Negative for abdominal distention, abdominal pain, blood in stool, constipation, diarrhea, nausea, rectal pain and vomiting.  Genitourinary: Negative for  dysuria, hematuria and testicular pain.  Musculoskeletal: Negative for arthralgias and joint swelling.  Skin: Negative for rash.  Neurological: Negative for dizziness, syncope and headaches.  Hematological: Negative for adenopathy.  Psychiatric/Behavioral: Negative for confusion and dysphoric mood.      Objective:    Physical Exam Constitutional:      General: He is not in acute distress.    Appearance: He is well-developed.  HENT:     Head: Normocephalic and atraumatic.     Right Ear: External ear normal.     Left Ear: External ear normal.  Eyes:  Conjunctiva/sclera: Conjunctivae normal.     Pupils: Pupils are equal, round, and reactive to light.  Neck:     Thyroid: No thyromegaly.  Cardiovascular:     Rate and Rhythm: Normal rate and regular rhythm.     Heart sounds: Normal heart sounds. No murmur heard.   Pulmonary:     Effort: No respiratory distress.     Breath sounds: No wheezing or rales.  Abdominal:     General: Bowel sounds are normal. There is no distension.     Palpations: Abdomen is soft. There is no mass.     Tenderness: There is no abdominal tenderness. There is no guarding or rebound.  Musculoskeletal:     Cervical back: Normal range of motion and neck supple.  Lymphadenopathy:     Cervical: No cervical adenopathy.  Skin:    Findings: No rash.  Neurological:     Mental Status: He is alert and oriented to person, place, and time.     Cranial Nerves: No cranial nerve deficit.     Deep Tendon Reflexes: Reflexes normal.     BP 128/72 (BP Location: Left Arm, Patient Position: Sitting, Cuff Size: Normal)   Pulse 76   Temp 97.9 F (36.6 C) (Oral)   Ht 5\' 9"  (1.753 m)   Wt 203 lb 9.6 oz (92.4 kg)   SpO2 99%   BMI 30.07 kg/m  Wt Readings from Last 3 Encounters:  05/29/20 203 lb 9.6 oz (92.4 kg)  05/12/19 211 lb 4.8 oz (95.8 kg)  05/06/18 205 lb 3.2 oz (93.1 kg)     There are no preventive care reminders to display for this patient.  There  are no preventive care reminders to display for this patient.  Lab Results  Component Value Date   TSH 0.95 03/11/2018   Lab Results  Component Value Date   WBC 7.0 03/11/2018   HGB 15.2 03/11/2018   HCT 44.0 03/11/2018   MCV 93.8 03/11/2018   PLT 195.0 03/11/2018   Lab Results  Component Value Date   NA 139 05/12/2019   K 4.0 05/12/2019   CO2 30 05/12/2019   GLUCOSE 107 (H) 05/12/2019   BUN 18 05/12/2019   CREATININE 1.03 05/12/2019   BILITOT 0.5 05/12/2019   ALKPHOS 59 05/12/2019   AST 20 05/12/2019   ALT 33 05/12/2019   PROT 7.0 05/12/2019   ALBUMIN 4.4 05/12/2019   CALCIUM 9.9 05/12/2019   GFR 72.38 05/12/2019   Lab Results  Component Value Date   CHOL 176 05/12/2019   Lab Results  Component Value Date   HDL 48.40 05/12/2019   Lab Results  Component Value Date   LDLCALC 87 03/11/2018   Lab Results  Component Value Date   TRIG 392.0 (H) 05/12/2019   Lab Results  Component Value Date   CHOLHDL 4 05/12/2019   Lab Results  Component Value Date   HGBA1C 5.4 04/06/2016      Assessment & Plan:   Problem List Items Addressed This Visit   None   Visit Diagnoses    Physical exam    -  Primary   Relevant Orders   Basic metabolic panel   Lipid panel   CBC with Differential/Platelet   TSH   Hepatic function panel   Need for pneumococcal vaccination       Relevant Orders   Pneumococcal conjugate vaccine 13-valent IM (Completed)    -Prevnar 13 given -Continue annual flu vaccine -Continue regular exercise habits -Consider Shingrix vaccine at  some point this year -Obtain follow-up screening labs as above.  He is getting annual PSA through urology and this is deferred  No orders of the defined types were placed in this encounter.   Follow-up: No follow-ups on file.    Carolann Littler, MD

## 2020-05-31 ENCOUNTER — Telehealth: Payer: Self-pay | Admitting: Family Medicine

## 2020-05-31 NOTE — Telephone Encounter (Signed)
Discussed results with patient, patient expressed understanding. Nothing further needed. ° °

## 2020-05-31 NOTE — Telephone Encounter (Signed)
Patient is calling and is requesting a call back for lab results

## 2020-08-05 ENCOUNTER — Other Ambulatory Visit: Payer: Self-pay | Admitting: Family Medicine

## 2020-08-05 NOTE — Telephone Encounter (Signed)
Last filled 11/30/2018 Last OV 05/29/2020  Ok to fill?

## 2020-09-24 DIAGNOSIS — C61 Malignant neoplasm of prostate: Secondary | ICD-10-CM | POA: Diagnosis not present

## 2020-09-24 DIAGNOSIS — Z298 Encounter for other specified prophylactic measures: Secondary | ICD-10-CM | POA: Diagnosis not present

## 2020-09-24 DIAGNOSIS — E782 Mixed hyperlipidemia: Secondary | ICD-10-CM | POA: Diagnosis not present

## 2020-09-24 DIAGNOSIS — G4700 Insomnia, unspecified: Secondary | ICD-10-CM | POA: Diagnosis not present

## 2020-09-24 DIAGNOSIS — Z7689 Persons encountering health services in other specified circumstances: Secondary | ICD-10-CM | POA: Diagnosis not present

## 2020-11-05 ENCOUNTER — Telehealth: Payer: Self-pay | Admitting: Family Medicine

## 2020-11-05 NOTE — Telephone Encounter (Signed)
Left message for patient to call back and schedule Medicare Annual Wellness Visit (AWV) either virtually or in office. Left  my Herbie Drape number [phone]   Last AWV 12/22/19 ; please schedule at anytime with [facility] Nurse Health Advisor 1 or 2   This should be a 45 minute visit.   [facility] can schedule awv calendar year

## 2020-11-12 NOTE — Telephone Encounter (Signed)
Pt returned my call  I tried call patient  back no answer

## 2021-06-07 ENCOUNTER — Other Ambulatory Visit: Payer: Self-pay | Admitting: Family Medicine

## 2021-07-03 ENCOUNTER — Other Ambulatory Visit: Payer: Self-pay | Admitting: Family Medicine

## 2021-07-25 ENCOUNTER — Other Ambulatory Visit: Payer: Self-pay | Admitting: Family Medicine

## 2021-08-11 ENCOUNTER — Other Ambulatory Visit: Payer: Self-pay | Admitting: Family Medicine

## 2021-09-05 ENCOUNTER — Other Ambulatory Visit: Payer: Self-pay | Admitting: Family Medicine
